# Patient Record
Sex: Female | Born: 1980 | Race: White | Hispanic: No | Smoking: Never smoker
Health system: Southern US, Community
[De-identification: ages and names within clinical notes are randomized; demographics above are authoritative.]

## PROBLEM LIST (undated history)

## (undated) DIAGNOSIS — K5090 Crohn's disease, unspecified, without complications: Secondary | ICD-10-CM

## (undated) DIAGNOSIS — Z789 Other specified health status: Secondary | ICD-10-CM

## (undated) HISTORY — DX: Crohn's disease, unspecified, without complications: K50.90

## (undated) HISTORY — PX: PILONIDAL CYST EXCISION: SHX744

## (undated) HISTORY — PX: CHOLECYSTECTOMY: SHX55

---

## 2015-08-16 ENCOUNTER — Encounter (HOSPITAL_COMMUNITY): Payer: Self-pay

## 2015-08-16 ENCOUNTER — Ambulatory Visit (HOSPITAL_COMMUNITY)
Admission: RE | Admit: 2015-08-16 | Discharge: 2015-08-16 | Disposition: A | Discharge status: Home / Self Care | Payer: Medicaid Other | Source: Ambulatory Visit | Attending: Obstetrics and Gynecology | Admitting: Obstetrics and Gynecology

## 2015-08-16 ENCOUNTER — Other Ambulatory Visit (HOSPITAL_COMMUNITY): Payer: Self-pay | Admitting: Obstetrics and Gynecology

## 2015-08-16 DIAGNOSIS — IMO0002 Reserved for concepts with insufficient information to code with codable children: Secondary | ICD-10-CM | POA: Insufficient documentation

## 2015-08-16 DIAGNOSIS — Z315 Encounter for genetic counseling: Secondary | ICD-10-CM | POA: Insufficient documentation

## 2015-08-16 DIAGNOSIS — Z3A12 12 weeks gestation of pregnancy: Secondary | ICD-10-CM | POA: Insufficient documentation

## 2015-08-16 DIAGNOSIS — O283 Abnormal ultrasonic finding on antenatal screening of mother: Secondary | ICD-10-CM | POA: Diagnosis not present

## 2015-08-16 DIAGNOSIS — Z3682 Encounter for antenatal screening for nuchal translucency: Secondary | ICD-10-CM

## 2015-08-16 DIAGNOSIS — IMO0001 Reserved for inherently not codable concepts without codable children: Secondary | ICD-10-CM

## 2015-08-16 DIAGNOSIS — O358XX0 Maternal care for other (suspected) fetal abnormality and damage, not applicable or unspecified: Secondary | ICD-10-CM

## 2015-08-16 HISTORY — DX: Other specified health status: Z78.9

## 2015-08-16 NOTE — Progress Notes (Signed)
Genetic Counseling  High-Risk Gestation Note  Appointment Date:  08/16/2015 Referred By: Sharlynn Oliphant, DO Date of Birth:  12/05/1981 Partner:  Lisbeth Renshaw   Pregnancy History: [id number] Estimated Date of Delivery: 02/24/16 Estimated Gestational Age: 4w4dAttending: MRenella Cunas MD   I met with Mrs. Jillian Perez and her partner, Mr. BLisbeth Renshaw for genetic counseling because of abnormal ultrasound findings.   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 Complete ultrasound results reported separately.    Ms. CJAHZARA SLATTERYwas counseled that a cystic hygroma describes a septated fluid filled sac at the back of the neck that typically results from failure of the fetal lymphatic system.  We discussed the various etiologies for a hygroma including normal variation (immature lymphatic system), a chromosome condition, single gene condition, or a congenital anomaly. Given the additional ultrasound findings today, we discussed the increased likelihood for an underlying chromosome condition or single gene etiology.   An omphalocele occurs in approximately in every 4000 births (M1:F5) and is defined as the protrusion of abdominal viscera through the umbilical ring, covered by membrane. This defect is thought to arise from failure of lateral body migration and body wall closure or from the embryonic persistence of the body stalk. Approximately  two thirds of all cases have associated anomalies, most commonly including: cardiac defects, neural tube defects, and cleft lip with or without palate.  Various causes of omphaloceles include sporadic, multifactorial, and genetic etiologies. Specifically, we discussed that omphaloceles are associated with a 30-50% risk of fetal aneuploidy (trisomies 13/18), other chromosome aberrations (microdeletions, microduplications, translocations, insertions), complexes such as OEIS, and genetic syndromes including Beckwith-Wiedemann syndrome (BWS) and specific single gene conditions.   We discussed chromosomes, non-disjunction, age related risks for aneuploidy, and the ~50% ultrasound adjusted risk for a chromosome condition based on the finding of a cystic hygroma.  We discussed the common features of Down syndrome, Turner syndrome, and trisomies 13 and 18.  We specifically discussed that the ultrasound findings were more suggestive of a condition such as trisomy 116  We also discussed that cystic hygromas can also be caused by a variety of other chromosome aberrations including: microdeletions, microduplications, and translocations.      We discussed the available screening option of noninvasive prenatal screening (NIPS)/cell free DNA testing. We reviewed the methodology of this screen, the conditions for which it assesses, detection rates and false positive rates. They understand that this screen is highly sensitive and specific but is not diagnostic for these conditions, nor does it assess for all chromosome conditions.   We reviewed the availability of diagnostic testing (chromosome analysis) via [facility] and amniocentesis.  She was counseled regarding the associated risks for complications for each.  Additionally, we discussed that microarray analysis can be performed on cells obtained from [facility] or amniocentesis.  They were counseled that microarray analysis is a molecular based technique in which a test sample of DNA  (fetal) is compared to a reference (normal) genome in order to determine if the test sample has any  extra or missing genetic information.  Microarray analysis allows for the detection of genetic deletions and duplications that are 017 times smaller than those identified by routine chromosome analysis.     After consideration of all the options, Ms. Shelsy A Barwick elected to proceed with NIPS (Panorama) today. She declined invasive, diagnostic testing given the associated risks of complications.    We also discussed that the ultrasound findings can be a feature of many underlying single gene conditions, such as Noonan syndrome, SLOS, and skeletal dysplasias.  We discussed that prenatal diagnosis is available for some single gene conditions, but that in most cases targeted analysis is recommended by a medical geneticist following a post-natal physical exam and review of medical records.  If however, ultrasound findings or a positive family history strongly suggest an increased risk for a specific condition, testing may be available to be performed prenatally via amniocentesis.    We discussed that the fetal prognosis is largely dependent upon the underlying cause of the ultrasound findings, but that there are indications by ultrasound, such as hydrops, which significantly increase the likelihood of a poor prognosis.  She understands that cystic hygromas may worsen and progress to hydrops fetalis, improve and regress, or remain unchanged at follow up ultrasounds.    Ms. Souter is scheduled to have an ultrasound on 09/03/15 to assess for hydrops and fetal viability and will return for a more detailed ultrasound at ~[redacted] weeks gestation on 09/24/15.  We discussed that while many of the causes of a cystic hygroma and omphalocele can be investigated during pregnancy, not all conditions and causes can be determined prenatally.  The couple indicated that they are not currently considering termination of pregnancy.       Both family histories were reviewed and found to be contributory for congenital heart disease for the patient's maternal uncle, requiring surgical correction in childhood. He is otherwise reportedly healthy. Congenital heart defects occur in approximately 1% of pregnancies.  Congenital heart defects may occur due to multifactorial influences, chromosomal abnormalities, genetic syndromes or environmental exposures.  Isolated heart defects are generally multifactorial.  Given the reported family history and assuming multifactorial inheritance, the risk for a congenital heart defect in the current pregnancy would not likely be increased based on this report. Without further information regarding the provided family history, an accurate genetic risk cannot be calculated. Further genetic counseling is warranted if more information is obtained.   Ms. Moree was provided with written information regarding cystic fibrosis (CF) including the carrier frequency and incidence in the Caucasian population, the availability of carrier testing and prenatal diagnosis if indicated.  In addition, we discussed that CF is routinely screened for as part of the [place] newborn screening panel.  She declined additional discussion of CF screening today.    Ms. Gallo denied exposure to environmental toxins or chemical agents. She denied the use of alcohol, tobacco or street drugs. She denied significant viral illnesses during the course of her pregnancy.     I counseled Ms. Albertia A Ravenscroft and her partner regarding the above risks and available options.  The approximate face-to-face time with the genetic counselor was 40 minutes.   Chipper Oman, MS Certified Genetic Counselor 08/16/2015

## 2015-08-16 NOTE — ED Notes (Signed)
Pt reports occasional right side pain.  No pain at this time.

## 2015-08-22 ENCOUNTER — Telehealth (HOSPITAL_COMMUNITY): Payer: Self-pay | Admitting: MS"

## 2015-08-22 NOTE — Telephone Encounter (Signed)
Ms. Jillian Perez called to see if NIPS  (Panorama) results were available yet. Discussed with patient that they are currently still pending but estimated to result out by 9/10. I will call patient when results are available.   Santiago Glad Ashon Rosenberg 08/22/2015

## 2015-08-23 ENCOUNTER — Telehealth (HOSPITAL_COMMUNITY): Payer: Self-pay | Admitting: MS"

## 2015-08-23 ENCOUNTER — Other Ambulatory Visit (HOSPITAL_COMMUNITY): Payer: Self-pay | Admitting: Obstetrics and Gynecology

## 2015-08-23 NOTE — Telephone Encounter (Signed)
Called Jillian Perez to discuss her cell free DNA test results.  Ms. ANHELICA FOWERS had Panorama testing through [facility].  Testing was offered because of abnormal ultrasound findings.   The patient was identified by name and DOB.  We reviewed that these are high risk for Trisomy 3. We reviewed that while not diagnostic, this result is associated with >99% positive predictive value given the fetal ultrasound findings in the pregnancy. We reviewed that the cell free fetal DNA test can not distinguish between aneuploidy confined to the placenta, trisomy 71, partial trisomy 47, or mosaic trisomy 61 and thus, confirmatory testing would be recommended either via amniocentesis or postnatal chromosome analysis. Ms. Mount reiterated that she does not wish to pursue amniocentesis in the pregnancy.   We briefly reviewed the variable features of trisomy 38 and the poor prognosis. We discussed that pregnancies with trisomy 98 have a significantly increased risk for miscarriage and stillbirth (estimated at approximately 70% from [redacted] weeks gestation to term). Of those that survive to term, ~90% of infants die during the first year of life. Ms. Jewell has follow-up ultrasounds scheduled in our office. I offered to meet again with the patient at any point and planned to gather additional resources for the couple. Ms. Marzano was encouraged to call back with additional questions or concerns. She stated that she did not have additional questions at this time.   Additionally, the NIPS showed a less than 1 in 10,000 risk for trisomies 21 and 13, as well as monosomy X (Turner syndrome).  In addition, the risk for triploidy/vanishing twin and sex chromosome trisomies (47,XXX and 47,XXY) was also low risk.  The false positive rate is <0.1% for all conditions. Testing was also consistent with female fetal sex.  The patient did wish to know fetal sex.  She understands that this testing does not identify all  genetic conditions.  All questions were answered to her satisfaction, she was encouraged to call with additional questions or concerns.  Chipper Oman, MS Certified Genetic Counselor 08/23/2015 10:55 AM

## 2015-08-26 ENCOUNTER — Other Ambulatory Visit (HOSPITAL_COMMUNITY): Payer: Self-pay | Admitting: Obstetrics and Gynecology

## 2015-08-26 ENCOUNTER — Encounter (HOSPITAL_COMMUNITY): Payer: Self-pay | Admitting: Obstetrics and Gynecology

## 2015-09-03 ENCOUNTER — Ambulatory Visit (HOSPITAL_COMMUNITY)
Admission: RE | Admit: 2015-09-03 | Discharge: 2015-09-03 | Disposition: A | Discharge status: Home / Self Care | Payer: Medicaid Other | Source: Ambulatory Visit | Attending: Obstetrics and Gynecology | Admitting: Obstetrics and Gynecology

## 2015-09-03 ENCOUNTER — Encounter (HOSPITAL_COMMUNITY): Payer: Self-pay

## 2015-09-03 ENCOUNTER — Ambulatory Visit (HOSPITAL_COMMUNITY): Admission: RE | Admit: 2015-09-03 | Payer: Medicaid Other | Source: Ambulatory Visit

## 2015-09-03 DIAGNOSIS — O021 Missed abortion: Secondary | ICD-10-CM | POA: Diagnosis not present

## 2015-09-03 DIAGNOSIS — Z3A15 15 weeks gestation of pregnancy: Secondary | ICD-10-CM | POA: Diagnosis not present

## 2015-09-03 DIAGNOSIS — O283 Abnormal ultrasonic finding on antenatal screening of mother: Secondary | ICD-10-CM | POA: Diagnosis present

## 2015-09-03 DIAGNOSIS — O351XX0 Maternal care for (suspected) chromosomal abnormality in fetus, not applicable or unspecified: Secondary | ICD-10-CM | POA: Insufficient documentation

## 2015-09-03 NOTE — Consult Note (Signed)
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  The patient is a 34 y.o. G3P2002,at 95w1dwith an EDD of 02/24/2016, by Last Menstrual Period dating method.  In addition to the abnormal cell-free fetal DNA testing several ultrasound abnormalities were identified on her last visit including cystic hygroma, omphalocele, and possible ONTD.  Unfortunately, on today's ultrasound no fetal cardiac activity was found indicating a missed abortion.  Review of Systems Pertinent items are noted in HPI.  Patient History OB History  Gravida Para Term Preterm AB SAB TAB Ectopic Multiple Living  3 2 2       2     # Outcome Date GA Lbr Len/2nd Weight Sex Delivery Anes PTL Lv  3 Current           2 Term 01/31/13 331w6d  Vag-Spont     1 Term 08/12/11     Vag-Spont         Past Medical History  Diagnosis Date  . Medical history non-contributory     Past Surgical History  Procedure Laterality Date  . Cholecystectomy    . Pilonidal cyst excision      Social History   Social History  . Marital Status: Unknown    Spouse Name: N/A  . Number of Children: N/A  . Years of Education: N/A   Social History Main Topics  . Smoking status: Never Smoker   . Smokeless tobacco: None  . Alcohol Use: No  . Drug Use: No  . Sexual Activity: Not Asked   Other Topics Concern  . None   Social History Narrative    History reviewed. No pertinent family history. In addition, the patient has no family history of mental retardation, birth defects, or genetic diseases.  Physical Examination Filed Vitals:   09/03/15 1034  BP: 116/68   Pulse: 67   General appearance - alert, well appearing, and in no distress Abdomen - soft, nontender, nondistended, no masses or organomegaly Pelvic - normal external genitalia, vulva, vagina, cervix, uterus and adnexa Extremities - no pedal edema noted  Assessment and Recommendations 1.  Missed abortion.  This is no unexpected given the likelihood of Trisomy 18.  I reviewed management options including induction of labor and D&E and reviewed the risks and benefits of both with Ms. RoSchergernd her husband.  She wishes to proceed with D&E.  [facility] After obtaining written informed consent for laminaria placement, two laminaria and one sponge were placed today in clinic without difficulty.    I spent 30 minutes with Ms. RoTatesoday of which 50% was face-to-face counseling.  [facility] Please do not hesitate to contact usKoreaith questions.   NIJolyn LentMD

## 2015-09-03 NOTE — ED Notes (Signed)
Pt reports sharp lower abd pain and cramping at times.

## 2015-09-24 ENCOUNTER — Ambulatory Visit (HOSPITAL_COMMUNITY): Payer: Medicaid Other

## 2015-10-11 ENCOUNTER — Encounter (HOSPITAL_COMMUNITY): Payer: Self-pay

## 2015-10-11 NOTE — Addendum Note (Signed)
Encounter addended by: Cottie Banda Corneliussen on: 10/11/2015  9:24 AM<BR>     Documentation filed: Episodes, Problem List

## 2016-12-14 HISTORY — PX: NASAL SEPTUM SURGERY: SHX37

## 2017-09-12 IMAGING — US US MFM OB LIMITED
1 series · 15 of 16 positions shown · non-contrast
Comparison: none

[Series 1: us mfm ob limited · 15 of 16 slices shown]
[im 1/16]
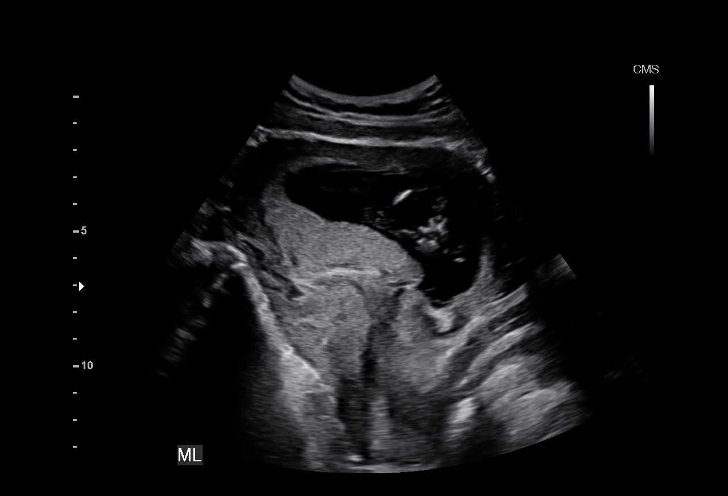
[im 2/16]
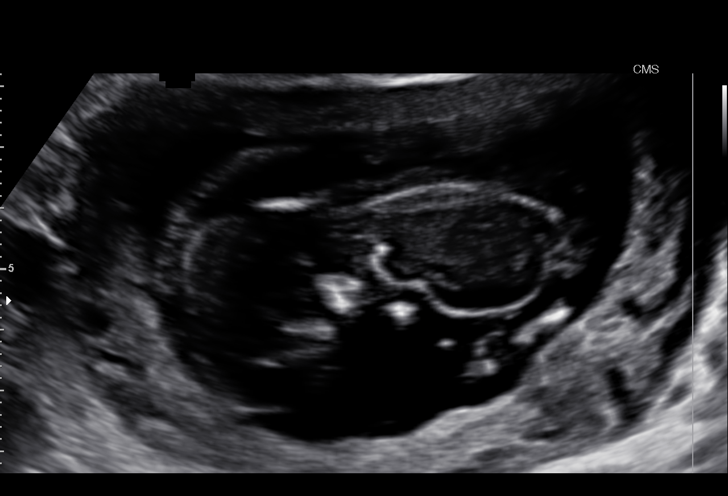
[im 3/16]
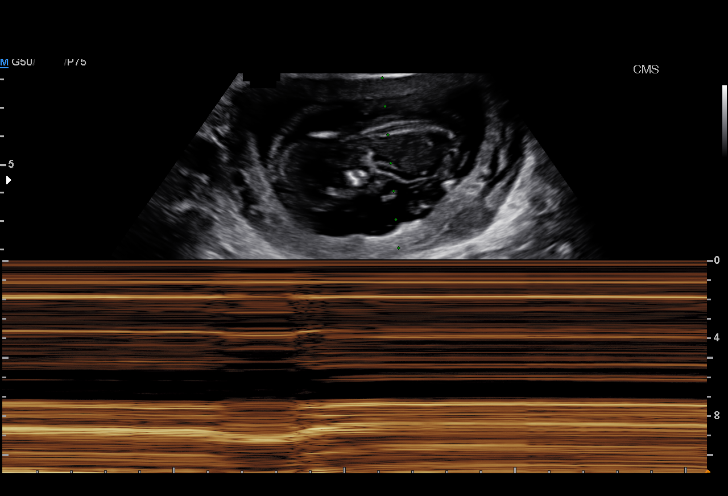
[im 4/16]
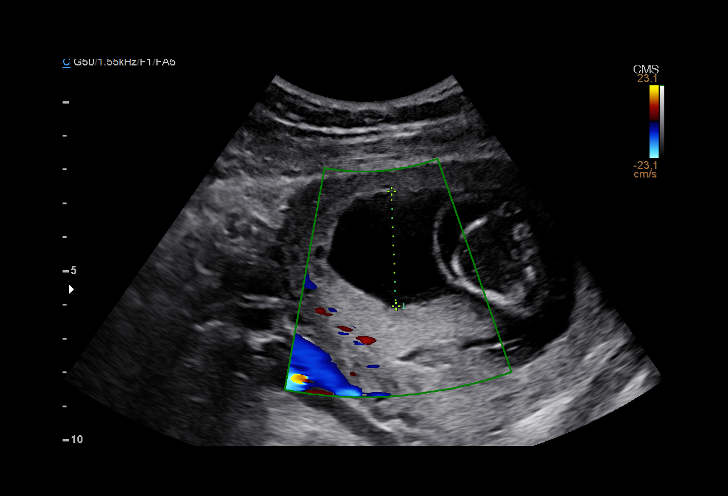
[im 5/16]
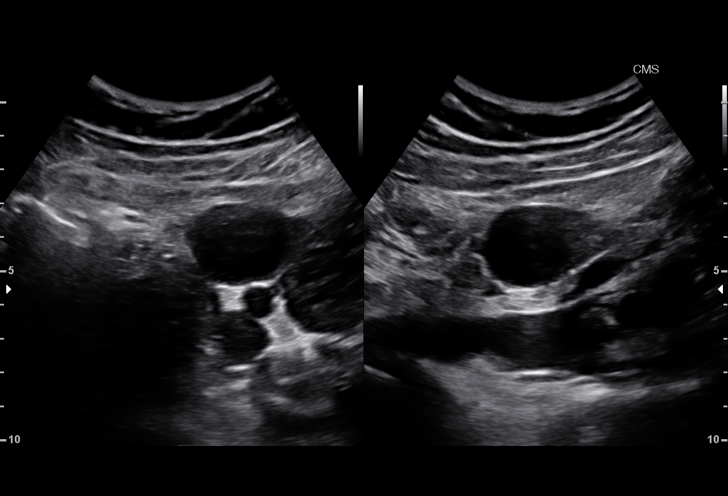
[im 6/16]
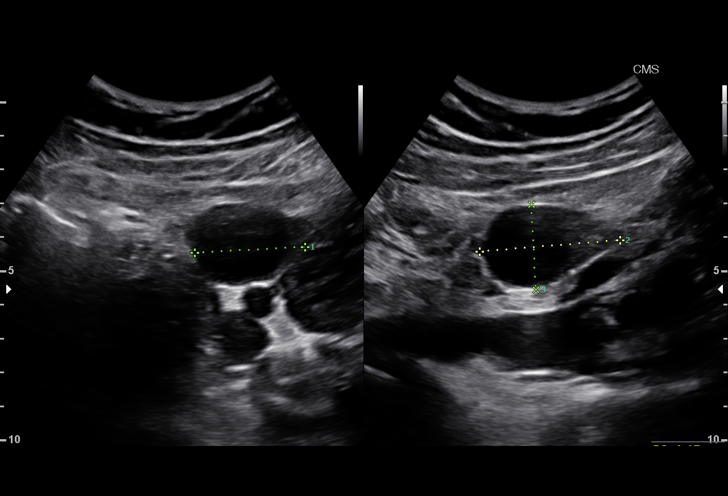
[im 7/16]
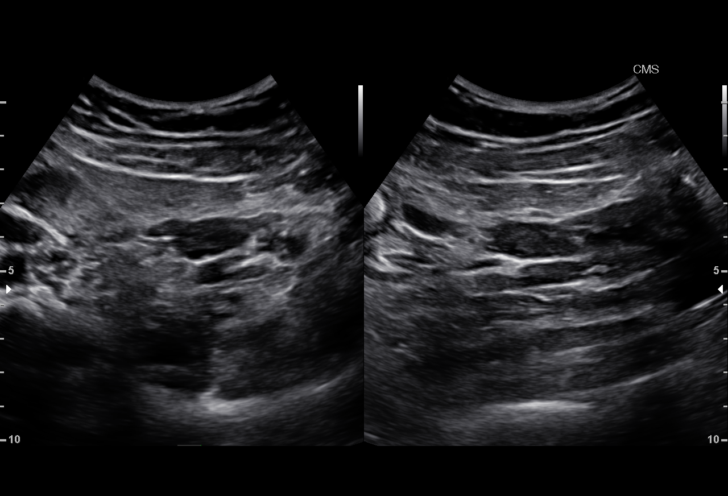
[im 9/16]
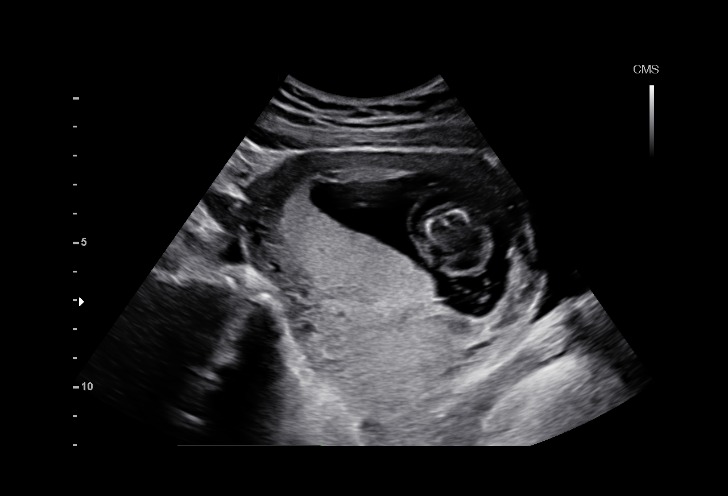
[im 10/16]
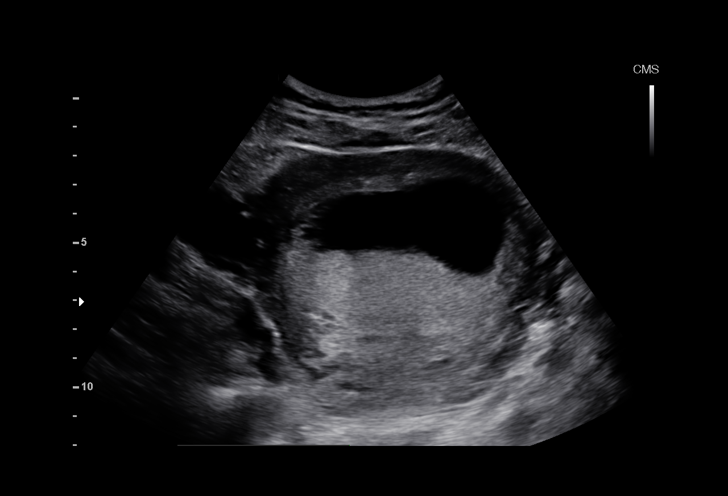
[im 11/16]
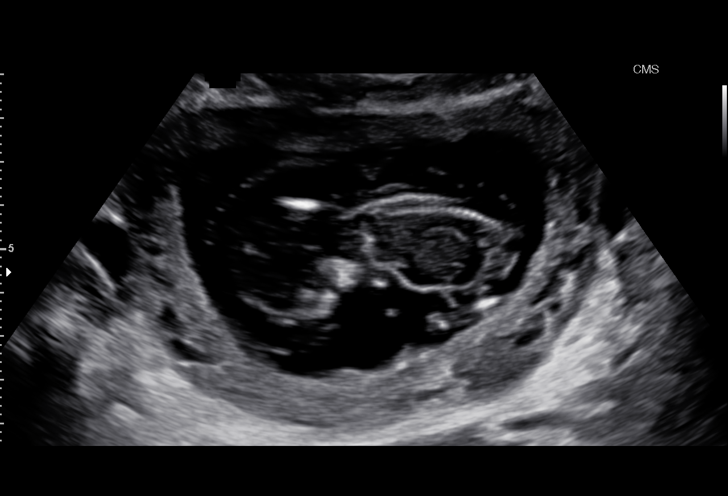
[im 12/16]
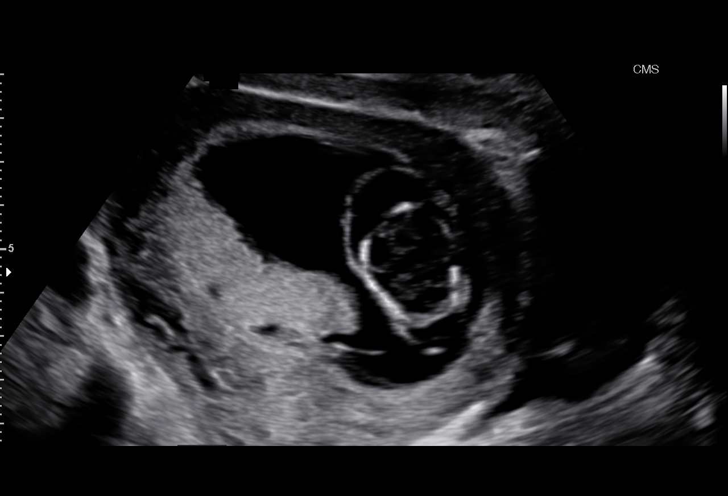
[im 13/16]
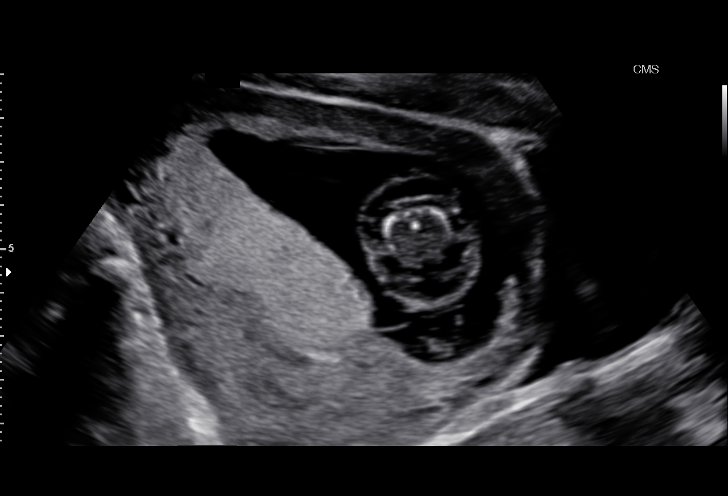
[im 14/16]
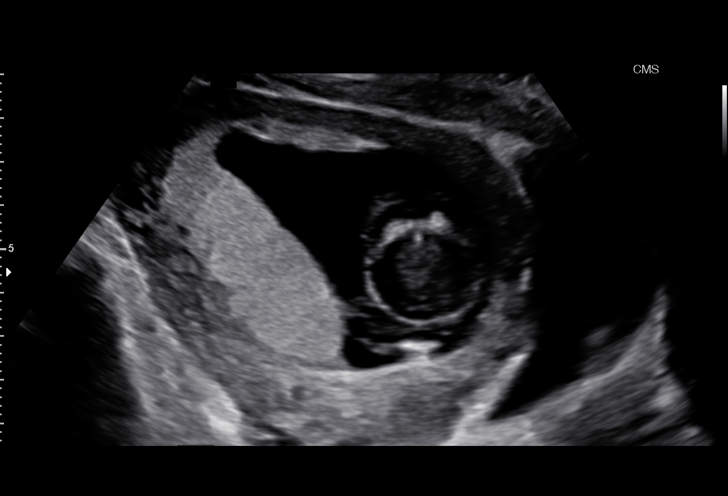
[im 15/16]
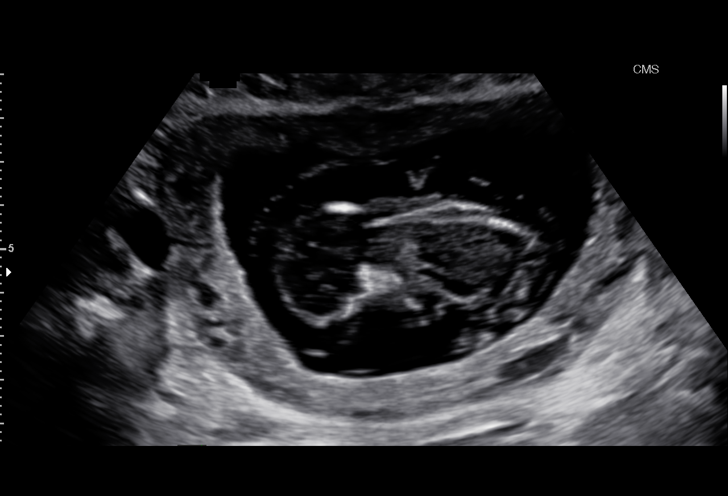
[im 16/16]
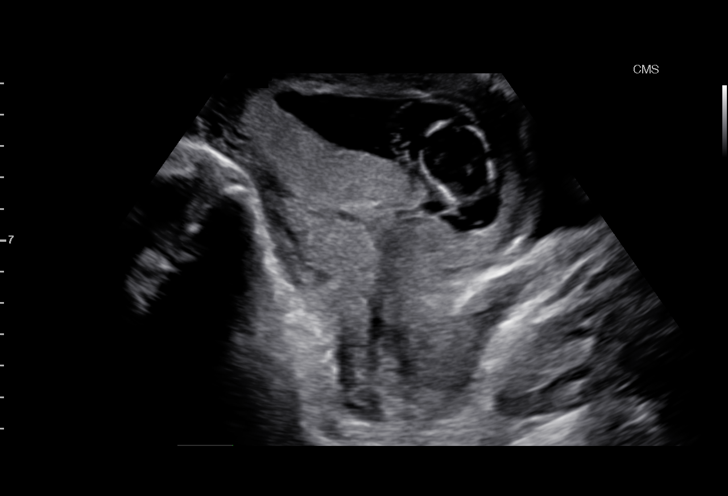

[15 of 16 positions shown; findings below may reference images not displayed]

OBSTETRICS REPORT
(Signed Final 09/03/2015 [DATE])

Service(s) Provided

US MFM OB LIMITED                                     76815.01
Indications

Fetal abnormality - other known or suspected;
thickened NT
15 weeks gestation of pregnancy
Abnormal biochemical finding on antenatal
screening of mother
Fetal Evaluation

Num Of Fetuses:    1
Cardiac Activity:  Absent
Presentation:      Transverse, head to
maternal right
Placenta:          Posterior

Amniotic Fluid
AFI FV:      Subjectively within normal limits
Larg Pckt:     3.4  cm
Gestational Age

LMP:           15w 1d        Date:  05/20/15                 EDD:   02/24/16
Best:          15w 1d     Det. By:  LMP  (05/20/15)          EDD:   02/24/16
Cervix Uterus Adnexa

Cervix:       Closed.
Uterus:       No abnormality visualized.
Cul De Sac:   No free fluid seen.
Left Ovary:    Within normal limits.
Right Ovary:   Within normal limits.

Adnexa:     No abnormality visualized.
Comments

Unfortunately, Ms. Rosean was diagnosed with a missed
abortion today.  I reviewed her management options including
induction of labor and D&E and reviewed the risks and
benefits of both.  She wishes to proceed with D&E.  This has
been scheduled for tomorrow at [facility]
After obtaining written informed consent for laminaria
placement, two laminaria and one sponge were placed today
in clinic without difficulty.
Impression

Missed abortion at 15 weeks 1 day.
Recommendations

See comments.

## 2018-02-18 DIAGNOSIS — H52221 Regular astigmatism, right eye: Secondary | ICD-10-CM | POA: Diagnosis not present

## 2018-02-18 DIAGNOSIS — H5213 Myopia, bilateral: Secondary | ICD-10-CM | POA: Diagnosis not present

## 2018-03-14 ENCOUNTER — Encounter: Payer: Self-pay | Admitting: Gastroenterology

## 2018-03-18 ENCOUNTER — Encounter: Payer: Self-pay | Admitting: Gastroenterology

## 2018-04-22 ENCOUNTER — Ambulatory Visit (INDEPENDENT_AMBULATORY_CARE_PROVIDER_SITE_OTHER): Payer: Medicaid Other | Admitting: Gastroenterology

## 2018-04-22 ENCOUNTER — Encounter: Payer: Self-pay | Admitting: Gastroenterology

## 2018-04-22 VITALS — BP 118/64 | HR 66 | Ht 64.0 in | Wt 191.0 lb

## 2018-04-22 DIAGNOSIS — K529 Noninfective gastroenteritis and colitis, unspecified: Secondary | ICD-10-CM | POA: Diagnosis not present

## 2018-04-22 MED ORDER — MESALAMINE ER 0.375 G PO CP24: 375.0000 mg | ORAL_CAPSULE | Freq: Every day | ORAL | 11 refills | Status: DC

## 2018-04-22 NOTE — Patient Instructions (Signed)
If you are age 36 or older, your body mass index should be between 23-30. Your Body mass index is 32.79 kg/m. If this is out of the aforementioned range listed, please consider follow up with your Primary Care Provider.  If you are age 76 or younger, your body mass index should be between 19-25. Your Body mass index is 32.79 kg/m. If this is out of the aformentioned range listed, please consider follow up with your Primary Care Provider.   Please follow-up with Dr. Lyndel Safe in one year.  Thank you,  Dr. Jackquline Denmark

## 2018-04-22 NOTE — Progress Notes (Signed)
    Chief Complaint: FU  Referring Provider: Dr Clint Bolder    ASSESSMENT AND PLAN;   #1. Colitis (no records available) - Please obtain previous records and update - Continue Apriso 4/day. - Await record de-conversion   HPI:    FU No problems. Patient is tolerating Apriso 4/day well.  No significant GI complaints.  No nonsteroidals. No records  Past Medical History:  Diagnosis Date  . Medical history non-contributory     Past Surgical History:  Procedure Laterality Date  . CHOLECYSTECTOMY    . PILONIDAL CYST EXCISION      Family History  Problem Relation Age of Onset  . Colon cancer Neg Hx     Social History   Tobacco Use  . Smoking status: Never Smoker  . Smokeless tobacco: Never Used  Substance Use Topics  . Alcohol use: No  . Drug use: No    Current Outpatient Medications  Medication Sig Dispense Refill  . fexofenadine (ALLEGRA) 180 MG tablet Take 180 mg by mouth daily.    . mesalamine (APRISO) 0.375 g 24 hr capsule Take 375 mg by mouth daily.     No current facility-administered medications for this visit.     No Known Allergies  Review of Systems:  Constitutional: Denies fever, chills, diaphoresis, appetite change and fatigue.  HEENT: Denies photophobia, eye pain, redness, hearing loss, ear pain, congestion, sore throat, rhinorrhea, sneezing, mouth sores, neck pain, neck stiffness and tinnitus.   Respiratory: Denies SOB, DOE, cough, chest tightness,  and wheezing.   Cardiovascular: Denies chest pain, palpitations and leg swelling.  Genitourinary: Denies dysuria, urgency, frequency, hematuria, flank pain and difficulty urinating.  Musculoskeletal: Denies myalgias, back pain, joint swelling, arthralgias and gait problem.  Skin: No rash.  Neurological: Denies dizziness, seizures, syncope, weakness, light-headedness, numbness and headaches.  Hematological: Denies adenopathy. Easy bruising, personal or family bleeding history  Psychiatric/Behavioral: No  anxiety or depression     Physical Exam:    BP 118/64   Pulse 66   Ht 5' 4"  (1.626 m)   Wt 191 lb (86.6 kg)   BMI 32.79 kg/m  Filed Weights   04/22/18 0910  Weight: 191 lb (86.6 kg)   Constitutional:  Well-developed, in no acute distress. Psychiatric: Normal mood and affect. Behavior is normal. HEENT: Pupils normal.  Conjunctivae are normal. No scleral icterus. Neck supple.  Cardiovascular: Normal rate, regular rhythm. No edema Pulmonary/chest: Effort normal and breath sounds normal. No wheezing, rales or rhonchi. Abdominal: Soft, nondistended. Nontender. Bowel sounds active throughout. There are no masses palpable. No hepatomegaly. Rectal:  defered Neurological: Alert and oriented to person place and time. Skin: Skin is warm and dry. No rashes noted.   Carmell Austria, MD

## 2018-09-26 ENCOUNTER — Other Ambulatory Visit: Payer: Self-pay | Admitting: Gastroenterology

## 2018-09-26 NOTE — Telephone Encounter (Signed)
Pharmacy calling to follow up on refill request for medication apriso. Pharmacy states they need it sent in as 4x daily due to them not being able to break the bottle?

## 2018-09-27 NOTE — Telephone Encounter (Signed)
Please advise 

## 2018-11-15 ENCOUNTER — Telehealth: Payer: Self-pay | Admitting: Gastroenterology

## 2018-11-18 NOTE — Telephone Encounter (Signed)
BG- that is fine.  TW-can we get all her records from [place].  Please see the previous note. Thx

## 2018-11-18 NOTE — Telephone Encounter (Signed)
Called and spoke with patient-patient reports abdominal pain/cramping; rectal bleeding with bowel movements; bad gas; patient is still taking the Apriso 4 caps every AM; patient is requesting what she can do/have to assist with symptom management;  An appt has been scheduled for this patient on 11/25/18 arrival at 3:45 pm for appt at 4:00 pm; if this is not what you feel is required for this patient's plan of care this appt can always be cancelled;  Patient verbalized understanding of information and that office will return call as soon as plan of action is in place; patient also advised to call if questions/concerns arise; Please advise on next step of action;

## 2018-11-21 NOTE — Telephone Encounter (Signed)
I have printed records from [place].

## 2018-11-25 ENCOUNTER — Encounter: Payer: Self-pay | Admitting: Gastroenterology

## 2018-11-25 ENCOUNTER — Telehealth: Payer: Self-pay | Admitting: *Deleted

## 2018-11-25 ENCOUNTER — Ambulatory Visit: Payer: Medicaid Other | Admitting: Gastroenterology

## 2018-11-25 ENCOUNTER — Other Ambulatory Visit (INDEPENDENT_AMBULATORY_CARE_PROVIDER_SITE_OTHER): Payer: Medicaid Other

## 2018-11-25 ENCOUNTER — Encounter (INDEPENDENT_AMBULATORY_CARE_PROVIDER_SITE_OTHER): Payer: Self-pay

## 2018-11-25 VITALS — BP 108/78 | HR 66 | Ht 64.0 in | Wt 195.0 lb

## 2018-11-25 DIAGNOSIS — K50919 Crohn's disease, unspecified, with unspecified complications: Secondary | ICD-10-CM

## 2018-11-25 MED ORDER — PREDNISONE 10 MG PO TABS: ORAL_TABLET | ORAL | 0 refills | Status: DC

## 2018-11-25 NOTE — Patient Instructions (Signed)
If you are age 36 or older, your body mass index should be between 23-30. Your Body mass index is 33.47 kg/m. If this is out of the aforementioned range listed, please consider follow up with your Primary Care Provider.  If you are age 55 or younger, your body mass index should be between 19-25. Your Body mass index is 33.47 kg/m. If this is out of the aformentioned range listed, please consider follow up with your Primary Care Provider.   Please go to the lab on the 2nd floor [address] before you leave the office today.   We have sent the following medications to your pharmacy for you to pick up at your convenience: We have sent the following medications to your pharmacy for you to pick up at your convenience: Prednisone 31m tablet 436mby mouth x 1 week. 3013my mouth x 1 week. 80m48m mouth x 2 weeks. 10mg59mmouth x 2 weeks.  Thank you,  Dr. RajesJackquline Denmark

## 2018-11-25 NOTE — Telephone Encounter (Signed)
Incorrect tube drawn for TB gold to [facility]. Notified pt and advised her we will redraw when she returns her stool samples on Monday. Lab appt made and future order re-entered.

## 2018-11-25 NOTE — Progress Notes (Signed)
Chief Complaint: bloody diarrhea  Referring Provider:  Janie Morning, DO      ASSESSMENT AND PLAN;   #1.  Crohn's disease with exacerbation. Dx 08/2017, involving the left colon up to mid transverse colon.  Had been well-controlled on Apriso until recently.  No perianal or TI disease  Plan: - Check CBC, CMP, sed rate, CRP,  TB gold, TPMT, HBsAg. - GI pathogen, WBC in stool. (Hold off on calprotectin since it is not covered) -Discussed regarding budesonide/prednisone.  Due to the severity of symptoms, would start prednisone 40 mg p.o. once a day for 1 weeks, 30 mg p.o. once a day for 1 weeks, followed by 20 mg p.o. once a day for 2 weeks, then 10 mg p.o. once a day for 2 weeks and then stop. - FU in 8 weeks.  She is to stay in touch with [place] over the phone. - Information about Humira given.  - If she does not get better in the next 2 to 3 days, would proceed with CT Enterography.   HPI:    Jillian Perez is a 37 y.o. female  Lower abdominal crampy pain with bloody diarrhea x 8 weeks, worse over the last 2-3 weeks With nocturnal symptoms Has associated tenesmus 7-8/day, more with eating No fever or chills. Denies having any significant joint pains, skin rash Has mild back pain  Past GI procedures: -EGD 03/31/2010-mild gastritis, negative small bowel biopsies for celiac -Colonoscopy 08/25/2017 colitis involving left colon, moderate activity.  Normal TI and right colon.  Biopsies consistent with Crohn's disease, inflammatory polyp.  Normal TI and right colonic biopsies. Additional social history Dad had lymphoma Mother had amyloidosis, cancer of the breast Past Medical History:  Diagnosis Date  . Medical history non-contributory     Past Surgical History:  Procedure Laterality Date  . CHOLECYSTECTOMY    . PILONIDAL CYST EXCISION      Family History  Problem Relation Age of Onset  . Colon cancer Neg Hx     Social History   Tobacco Use  . Smoking status: Never  Smoker  . Smokeless tobacco: Never Used  Substance Use Topics  . Alcohol use: No  . Drug use: No    Current Outpatient Medications  Medication Sig Dispense Refill  . APRISO 0.375 g 24 hr capsule TAKE ONE CAPSULE BY MOUTH FOUR TIMES DAILY 120 capsule 11  . fexofenadine (ALLEGRA) 180 MG tablet Take 180 mg by mouth daily.     No current facility-administered medications for this visit.     No Known Allergies  Review of Systems:  neg     Physical Exam:    BP 108/78   Pulse 66   Ht 5' 4"  (1.626 m)   Wt 195 lb (88.5 kg)   SpO2 99%   BMI 33.47 kg/m  Filed Weights   11/25/18 1348  Weight: 195 lb (88.5 kg)   Constitutional:  Well-developed, in no acute distress. Psychiatric: Normal mood and affect. Behavior is normal. HEENT: Pupils normal.  Conjunctivae are normal. No scleral icterus. Neck supple.  Cardiovascular: Normal rate, regular rhythm. No edema Pulmonary/chest: Effort normal and breath sounds normal. No wheezing, rales or rhonchi. Abdominal: Soft, nondistended.  Lower abdominal tenderness mild, no rebound.  Bowel sounds active throughout. There are no masses palpable. No hepatomegaly. Rectal:  defered Neurological: Alert and oriented to person place and time. Skin: Skin is warm and dry. No rashes noted.    Carmell Austria, MD 11/25/2018, 2:17 PM  Cc: Janie Morning, DO

## 2018-11-28 ENCOUNTER — Other Ambulatory Visit (INDEPENDENT_AMBULATORY_CARE_PROVIDER_SITE_OTHER): Payer: Medicaid Other

## 2018-11-28 DIAGNOSIS — K50919 Crohn's disease, unspecified, with unspecified complications: Secondary | ICD-10-CM

## 2018-11-30 LAB — COMPREHENSIVE METABOLIC PANEL
AG RATIO: 1.4 (calc) (ref 1.0–2.5)
ALKALINE PHOSPHATASE (APISO): 107 U/L (ref 33–115)
ALT: 26 U/L (ref 6–29)
AST: 20 U/L (ref 10–30)
Albumin: 4.3 g/dL (ref 3.6–5.1)
BUN: 10 mg/dL (ref 7–25)
CHLORIDE: 105 mmol/L (ref 98–110)
CO2: 27 mmol/L (ref 20–32)
Calcium: 9.7 mg/dL (ref 8.6–10.2)
Creat: 0.75 mg/dL (ref 0.50–1.10)
GLOBULIN: 3 g/dL (ref 1.9–3.7)
Glucose, Bld: 88 mg/dL (ref 65–99)
Potassium: 5.2 mmol/L (ref 3.5–5.3)
Sodium: 141 mmol/L (ref 135–146)
Total Bilirubin: 0.3 mg/dL (ref 0.2–1.2)
Total Protein: 7.3 g/dL (ref 6.1–8.1)

## 2018-11-30 LAB — CBC WITH DIFFERENTIAL/PLATELET
ABSOLUTE MONOCYTES: 757 {cells}/uL (ref 200–950)
BASOS ABS: 107 {cells}/uL (ref 0–200)
BASOS PCT: 1.1 %
EOS ABS: 1009 {cells}/uL — AB (ref 15–500)
Eosinophils Relative: 10.4 %
HCT: 39.8 % (ref 35.0–45.0)
Hemoglobin: 12.9 g/dL (ref 11.7–15.5)
Lymphs Abs: 3861 cells/uL (ref 850–3900)
MCH: 28 pg (ref 27.0–33.0)
MCHC: 32.4 g/dL (ref 32.0–36.0)
MCV: 86.5 fL (ref 80.0–100.0)
MPV: 11.8 fL (ref 7.5–12.5)
Monocytes Relative: 7.8 %
Neutro Abs: 3967 cells/uL (ref 1500–7800)
Neutrophils Relative %: 40.9 %
PLATELETS: 343 10*3/uL (ref 140–400)
RBC: 4.6 10*6/uL (ref 3.80–5.10)
RDW: 12.6 % (ref 11.0–15.0)
TOTAL LYMPHOCYTE: 39.8 %
WBC: 9.7 10*3/uL (ref 3.8–10.8)

## 2018-11-30 LAB — HEPATITIS B SURFACE ANTIGEN: Hepatitis B Surface Ag: NONREACTIVE

## 2018-11-30 LAB — SEDIMENTATION RATE: SED RATE: 14 mm/h (ref 0–20)

## 2018-11-30 LAB — QUANTIFERON-TB GOLD PLUS

## 2018-11-30 LAB — C-REACTIVE PROTEIN: CRP: 13 mg/L — AB (ref <8.0)

## 2018-12-01 LAB — FECAL LACTOFERRIN, QUANT
FECAL LACTOFERRIN: POSITIVE — AB
MICRO NUMBER:: 91501812
SPECIMEN QUALITY:: ADEQUATE

## 2018-12-01 LAB — QUANTIFERON-TB GOLD PLUS
Mitogen-NIL: 7 IU/mL
NIL: 0.04 IU/mL
QuantiFERON-TB Gold Plus: NEGATIVE
TB1-NIL: 0.1 IU/mL
TB2-NIL: 0.11 IU/mL

## 2018-12-01 LAB — TPMT GENETIC TEST

## 2018-12-01 LAB — GASTROINTESTINAL PATHOGEN PANEL PCR
C. difficile Tox A/B, PCR: NOT DETECTED
Campylobacter, PCR: NOT DETECTED
Cryptosporidium, PCR: NOT DETECTED
E COLI (ETEC) LT/ST, PCR: NOT DETECTED
E COLI (STEC) STX1/STX2, PCR: NOT DETECTED
E coli 0157, PCR: NOT DETECTED
Giardia lamblia, PCR: NOT DETECTED
NOROVIRUS, PCR: NOT DETECTED
ROTAVIRUS, PCR: NOT DETECTED
SALMONELLA, PCR: NOT DETECTED
Shigella, PCR: NOT DETECTED

## 2019-01-02 ENCOUNTER — Telehealth: Payer: Self-pay | Admitting: Gastroenterology

## 2019-01-02 ENCOUNTER — Other Ambulatory Visit: Payer: Self-pay

## 2019-01-02 DIAGNOSIS — K50919 Crohn's disease, unspecified, with unspecified complications: Secondary | ICD-10-CM

## 2019-01-02 NOTE — Telephone Encounter (Signed)
Patient states her symptoms have not improved and wants to know if Dr.Gupta wants to go ahead with the test they discussed at her last visit on 12.13.2019.

## 2019-01-02 NOTE — Progress Notes (Signed)
Order placed in Epic for CTE as recommended by MD;

## 2019-01-02 NOTE — Telephone Encounter (Signed)
Proceed with CTE See last note

## 2019-01-02 NOTE — Telephone Encounter (Signed)
Called and spoke with patient-patient informed of result note and MD recommendations; patient is agreeable with plan of care and verified PCP; result note routed to PCP per MD recommendations; Patient has been scheduled for CT enterography on 01/10/2019 at [facility]; arrival at 2:00pm for 3:00pm appt; patient advised to be NPO for 4 hrs prior to test and will consume contrast on arrival for procedure; Patient verbalized understanding of information/instructions;  Patient was advised to call the office at [phone] if questions/concerns arise;

## 2019-01-02 NOTE — Telephone Encounter (Signed)
Please advise on MD recommendations;

## 2019-01-10 ENCOUNTER — Ambulatory Visit (HOSPITAL_COMMUNITY)
Admission: RE | Admit: 2019-01-10 | Discharge: 2019-01-10 | Disposition: A | Discharge status: Home / Self Care | Payer: Medicaid Other | Source: Ambulatory Visit | Attending: Gastroenterology | Admitting: Gastroenterology

## 2019-01-10 DIAGNOSIS — K50919 Crohn's disease, unspecified, with unspecified complications: Secondary | ICD-10-CM | POA: Diagnosis not present

## 2019-01-10 DIAGNOSIS — R197 Diarrhea, unspecified: Secondary | ICD-10-CM | POA: Diagnosis not present

## 2019-01-10 MED ORDER — SODIUM CHLORIDE (PF) 0.9 % IJ SOLN
INTRAMUSCULAR | Status: AC
  Filled 2019-01-10: qty 50

## 2019-01-10 MED ORDER — BARIUM SULFATE 0.1 % PO SUSP
ORAL | Status: AC
  Filled 2019-01-10: qty 3

## 2019-01-10 MED ORDER — IOHEXOL 300 MG/ML  SOLN
100.0000 mL | Freq: Once | INTRAMUSCULAR | Status: AC | PRN
  Administered 2019-01-10: 100 mL via INTRAVENOUS

## 2019-01-11 ENCOUNTER — Telehealth: Payer: Self-pay | Admitting: Gastroenterology

## 2019-01-11 NOTE — Telephone Encounter (Signed)
[facility] called to give results on pt's CT.

## 2019-01-11 NOTE — Telephone Encounter (Signed)
Called and spoke with Marzetta Board at [facility] CT -she wanted to ensure the provider was provided with the CT entero results form patient's scan; results printed and available for Dr. Lyndel Safe to review;

## 2019-01-17 ENCOUNTER — Other Ambulatory Visit: Payer: Self-pay

## 2019-01-17 DIAGNOSIS — K50919 Crohn's disease, unspecified, with unspecified complications: Secondary | ICD-10-CM

## 2019-01-17 MED ORDER — ADALIMUMAB 40 MG/0.4ML ~~LOC~~ AJKT: 40.0000 mg | AUTO-INJECTOR | SUBCUTANEOUS | 11 refills | Status: DC

## 2019-01-17 MED ORDER — ADALIMUMAB 80 MG/0.8ML ~~LOC~~ AJKT: 80.0000 mg | AUTO-INJECTOR | SUBCUTANEOUS | 0 refills | Status: DC

## 2019-01-17 MED ORDER — MESALAMINE 4 G RE ENEM: 4.0000 g | ENEMA | Freq: Every day | RECTAL | 1 refills | Status: DC

## 2019-01-17 MED ORDER — AZATHIOPRINE 50 MG PO TABS: ORAL_TABLET | ORAL | 3 refills | Status: DC

## 2019-01-26 ENCOUNTER — Telehealth: Payer: Self-pay | Admitting: Gastroenterology

## 2019-01-26 NOTE — Telephone Encounter (Signed)
Called and spoke with patient-patient reports she is NOT taking the Prednisone, however, she is taking the Apriso, the Imuran, and the Rowasa enemas-patient reports she is doing great at this time; Humira was approved by her insurance and she is requesting to know if she needs to start on the Humira and when-  please advise on next step in plan of care for this patient-

## 2019-01-30 NOTE — Telephone Encounter (Signed)
Called and spoke with patient-patient informed of result note and MD recommendations; patient is agreeable with plan of care; Patient verbalized understanding of starting Humira as per MD recommendations as long as patient is not currently running a fever or sick at this time; Patient verbalized understanding of information/instructions;  Patient was advised to call the office at [phone] if questions/concerns arise;

## 2019-01-30 NOTE — Telephone Encounter (Signed)
CTE reviewed See plan as per CTE note  Lets start Humira as soon as possible. Humira day 1 120m, day 15 874m day 29- 4018mQ every other week. FU in 4-6 weeks Continue rest of meds for now

## 2019-01-31 ENCOUNTER — Other Ambulatory Visit (INDEPENDENT_AMBULATORY_CARE_PROVIDER_SITE_OTHER): Payer: Medicaid Other

## 2019-01-31 DIAGNOSIS — K50919 Crohn's disease, unspecified, with unspecified complications: Secondary | ICD-10-CM

## 2019-01-31 LAB — CBC WITH DIFFERENTIAL/PLATELET
Basophils Absolute: 0.1 10*3/uL (ref 0.0–0.1)
Basophils Relative: 1.1 % (ref 0.0–3.0)
EOS ABS: 0.9 10*3/uL — AB (ref 0.0–0.7)
Eosinophils Relative: 10.8 % — ABNORMAL HIGH (ref 0.0–5.0)
HEMATOCRIT: 39.2 % (ref 36.0–46.0)
Hemoglobin: 12.6 g/dL (ref 12.0–15.0)
Lymphocytes Relative: 41.3 % (ref 12.0–46.0)
Lymphs Abs: 3.5 10*3/uL (ref 0.7–4.0)
MCHC: 32.2 g/dL (ref 30.0–36.0)
MCV: 87.2 fl (ref 78.0–100.0)
Monocytes Absolute: 0.6 10*3/uL (ref 0.1–1.0)
Monocytes Relative: 7.7 % (ref 3.0–12.0)
Neutro Abs: 3.3 10*3/uL (ref 1.4–7.7)
Neutrophils Relative %: 39.1 % — ABNORMAL LOW (ref 43.0–77.0)
Platelets: 277 10*3/uL (ref 150.0–400.0)
RBC: 4.5 Mil/uL (ref 3.87–5.11)
RDW: 13.9 % (ref 11.5–15.5)
WBC: 8.4 10*3/uL (ref 4.0–10.5)

## 2019-01-31 LAB — COMPREHENSIVE METABOLIC PANEL
ALT: 21 U/L (ref 0–35)
AST: 18 U/L (ref 0–37)
Albumin: 4.1 g/dL (ref 3.5–5.2)
Alkaline Phosphatase: 90 U/L (ref 39–117)
BUN: 13 mg/dL (ref 6–23)
CO2: 28 mEq/L (ref 19–32)
Calcium: 9.5 mg/dL (ref 8.4–10.5)
Chloride: 105 mEq/L (ref 96–112)
Creatinine, Ser: 0.82 mg/dL (ref 0.40–1.20)
GFR: 78.46 mL/min (ref >60.00)
Glucose, Bld: 53 mg/dL — ABNORMAL LOW (ref 70–99)
Potassium: 4.9 mEq/L (ref 3.5–5.1)
Sodium: 141 mEq/L (ref 135–145)
Total Bilirubin: 0.4 mg/dL (ref 0.2–1.2)
Total Protein: 7.2 g/dL (ref 6.0–8.3)

## 2019-01-31 LAB — C-REACTIVE PROTEIN: CRP: <1 mg/dL (ref 0.5–20.0)

## 2019-02-14 ENCOUNTER — Other Ambulatory Visit (INDEPENDENT_AMBULATORY_CARE_PROVIDER_SITE_OTHER): Payer: Medicaid Other

## 2019-02-14 DIAGNOSIS — K50919 Crohn's disease, unspecified, with unspecified complications: Secondary | ICD-10-CM

## 2019-02-14 LAB — CBC WITH DIFFERENTIAL/PLATELET
Basophils Absolute: 0.1 10*3/uL (ref 0.0–0.1)
Basophils Relative: 0.9 % (ref 0.0–3.0)
Eosinophils Absolute: 0.4 10*3/uL (ref 0.0–0.7)
Eosinophils Relative: 4.8 % (ref 0.0–5.0)
HCT: 38.8 % (ref 36.0–46.0)
Hemoglobin: 12.6 g/dL (ref 12.0–15.0)
Lymphocytes Relative: 48.7 % — ABNORMAL HIGH (ref 12.0–46.0)
Lymphs Abs: 3.7 10*3/uL (ref 0.7–4.0)
MCHC: 32.4 g/dL (ref 30.0–36.0)
MCV: 87.5 fl (ref 78.0–100.0)
Monocytes Absolute: 0.4 10*3/uL (ref 0.1–1.0)
Monocytes Relative: 5.5 % (ref 3.0–12.0)
Neutro Abs: 3 10*3/uL (ref 1.4–7.7)
Neutrophils Relative %: 40.1 % — ABNORMAL LOW (ref 43.0–77.0)
Platelets: 282 10*3/uL (ref 150.0–400.0)
RBC: 4.44 Mil/uL (ref 3.87–5.11)
RDW: 14.1 % (ref 11.5–15.5)
WBC: 7.5 10*3/uL (ref 4.0–10.5)

## 2019-02-14 LAB — COMPREHENSIVE METABOLIC PANEL
ALK PHOS: 77 U/L (ref 39–117)
ALT: 26 U/L (ref 0–35)
AST: 23 U/L (ref 0–37)
Albumin: 4.5 g/dL (ref 3.5–5.2)
BUN: 14 mg/dL (ref 6–23)
CO2: 28 mEq/L (ref 19–32)
Calcium: 9.5 mg/dL (ref 8.4–10.5)
Chloride: 104 mEq/L (ref 96–112)
Creatinine, Ser: 0.71 mg/dL (ref 0.40–1.20)
GFR: 92.63 mL/min (ref >60.00)
Glucose, Bld: 91 mg/dL (ref 70–99)
POTASSIUM: 4.3 meq/L (ref 3.5–5.1)
Sodium: 139 mEq/L (ref 135–145)
TOTAL PROTEIN: 7.2 g/dL (ref 6.0–8.3)
Total Bilirubin: 0.7 mg/dL (ref 0.2–1.2)

## 2019-02-14 LAB — C-REACTIVE PROTEIN: CRP: <1 mg/dL (ref 0.5–20.0)

## 2019-02-16 ENCOUNTER — Ambulatory Visit: Payer: Medicaid Other | Admitting: Gastroenterology

## 2019-02-28 ENCOUNTER — Ambulatory Visit: Payer: Medicaid Other | Admitting: Gastroenterology

## 2019-03-14 ENCOUNTER — Other Ambulatory Visit: Payer: Self-pay | Admitting: Gastroenterology

## 2019-03-14 DIAGNOSIS — K50919 Crohn's disease, unspecified, with unspecified complications: Secondary | ICD-10-CM

## 2019-04-18 DIAGNOSIS — F4321 Adjustment disorder with depressed mood: Secondary | ICD-10-CM | POA: Diagnosis not present

## 2019-04-25 DIAGNOSIS — F4321 Adjustment disorder with depressed mood: Secondary | ICD-10-CM | POA: Diagnosis not present

## 2019-05-09 DIAGNOSIS — F4321 Adjustment disorder with depressed mood: Secondary | ICD-10-CM | POA: Diagnosis not present

## 2019-05-10 ENCOUNTER — Other Ambulatory Visit: Payer: Self-pay | Admitting: Gastroenterology

## 2019-05-10 DIAGNOSIS — K50919 Crohn's disease, unspecified, with unspecified complications: Secondary | ICD-10-CM

## 2019-05-23 DIAGNOSIS — F4321 Adjustment disorder with depressed mood: Secondary | ICD-10-CM | POA: Diagnosis not present

## 2019-05-30 DIAGNOSIS — F4321 Adjustment disorder with depressed mood: Secondary | ICD-10-CM | POA: Diagnosis not present

## 2019-06-06 DIAGNOSIS — F4321 Adjustment disorder with depressed mood: Secondary | ICD-10-CM | POA: Diagnosis not present

## 2019-06-27 DIAGNOSIS — F4321 Adjustment disorder with depressed mood: Secondary | ICD-10-CM | POA: Diagnosis not present

## 2019-08-04 ENCOUNTER — Other Ambulatory Visit: Payer: Self-pay | Admitting: Gastroenterology

## 2019-08-04 DIAGNOSIS — K50919 Crohn's disease, unspecified, with unspecified complications: Secondary | ICD-10-CM

## 2019-10-16 ENCOUNTER — Other Ambulatory Visit: Payer: Self-pay | Admitting: Gastroenterology

## 2019-10-16 NOTE — Telephone Encounter (Signed)
Can patient have refill of the Apriso or do you want to her to be seen in the office before approving medication since she is suppose to be on Humira as well and haven't followed up with [place] recently?

## 2020-01-08 ENCOUNTER — Telehealth (INDEPENDENT_AMBULATORY_CARE_PROVIDER_SITE_OTHER): Payer: Medicaid Other | Admitting: Gastroenterology

## 2020-01-08 ENCOUNTER — Other Ambulatory Visit: Payer: Self-pay

## 2020-01-08 VITALS — Ht 64.0 in | Wt 190.0 lb

## 2020-01-08 DIAGNOSIS — K50919 Crohn's disease, unspecified, with unspecified complications: Secondary | ICD-10-CM

## 2020-01-08 MED ORDER — AZATHIOPRINE 50 MG PO TABS: 50.0000 mg | ORAL_TABLET | Freq: Every day | ORAL | 11 refills | Status: DC

## 2020-01-08 MED ORDER — PREDNISONE 10 MG PO TABS: ORAL_TABLET | ORAL | 0 refills | Status: DC

## 2020-01-08 NOTE — Patient Instructions (Signed)
If you are age 38 or older, your body mass index should be between 23-30. Your Body mass index is 32.61 kg/m. If this is out of the aforementioned range listed, please consider follow up with your Primary Care Provider.  If you are age 40 or younger, your body mass index should be between 19-25. Your Body mass index is 32.61 kg/m. If this is out of the aformentioned range listed, please consider follow up with your Primary Care Provider.   We have sent the following medications to your pharmacy for you to pick up at your convenience: Prednisone  Imuran   Please go to the lab at [facility] ([place] You will need to go to level "B", you do not need an appointment for this. Hours available are 7:30 am - 4:30 pm.   Follow up in 8 weeks.  Thank you,  Dr. Jackquline Denmark

## 2020-01-08 NOTE — Progress Notes (Signed)
Chief Complaint: bloody diarrhea  Referring Provider:  Janie Morning, DO      ASSESSMENT AND PLAN;   #1.  Crohn's disease with exacerbation. Dx 08/2017, involving the left colon up to mid transverse colon.  No perianal or TI disease. CTE 12/2018: Involving sigmoid colon and rectum.  Plan: - Continue Humira 55m Q2 weekly - Restart Imuran 551mpo qd #30, 11 refills. - Check CBC, CMP, sed rate, CRP, ADA antibody and trough level. -Stool studies: GI pathogen and calprotectin - Discussed regarding budesonide/prednisone.  Due to the severity of symptoms, would start prednisone 40 mg p.o. once a day for 1 week, 30 mg p.o. once a day for 1 week, followed by 20 mg p.o. once a day for 2 weeks, then 10 mg p.o. once a day for 2 weeks and then stop. - FU in 8 weeks.  She is to stay in touch with [place] the phone. - At FU, needs colon with MiraLAX preparation.   HPI:    Jillian Perez a [age].o. female  Lower abdominal crampy pain with bloody diarrhea x 8 weeks, worse over the last 2-3 weeks.  Stopped Imuran 2 months ago due to insurance reasons. With nocturnal symptoms Has associated tenesmus 7-8/day, more with eating No fever or chills. Denies having any significant joint pains, skin rash Has mild back pain No recent antibiotics.  Able to tolerate p.o. without any problems. Had similar problems a year ago.  CTE as below.  Past GI procedures: -EGD 03/31/2010-mild gastritis, negative small bowel biopsies for celiac -Colonoscopy 08/25/2017 colitis involving left colon, moderate activity.  Normal TI and right colon. Bx- Crohn's disease, inflammatory polyp.  Normal TI and right colonic biopsies. -CTE 12/2018: 1. Long segment of uniform bowel wall thickening involving the entire sigmoid colon and rectum consistent with segmental colitis. Extensive adenopathy within the sigmoid mesocolon most consistent with inflammatory or infectious process. Differential would include inflammatory  bowel disease (ulcer colitis versus Crohn's disease) versus infectious colitis versus unlikely ischemic colitis. 2. Remainder of the GI tract is normal. 3. Postcholecystectomy.   Additional social history Dad had lymphoma Mother had amyloidosis, cancer of the breast Past Medical History:  Diagnosis Date  . Medical history non-contributory     Past Surgical History:  Procedure Laterality Date  . CHOLECYSTECTOMY    . PILONIDAL CYST EXCISION      Family History  Problem Relation Age of Onset  . Colon cancer Neg Hx     Social History   Tobacco Use  . Smoking status: Never Smoker  . Smokeless tobacco: Never Used  Substance Use Topics  . Alcohol use: No  . Drug use: No    Current Outpatient Medications  Medication Sig Dispense Refill  . APRISO 0.375 g 24 hr capsule TAKE ONE CAPSULE BY MOUTH FOUR TIMES DAILY (Patient taking differently: 1.5 g daily with breakfast. ) 120 capsule 11  . azaTHIOprine (IMURAN) 50 MG tablet TAKE 1/2 TABLET BY MOUTH EVERY DAY FOR 1 WEEK, THEN 1 TABLET DAILY THEREAFTER (Patient not taking: Reported on 01/04/2020) 30 tablet 3  . FIBER PO Take by mouth.    . Marland KitchenUMIRA PEN 40 MG/0.4ML PNKT INJECT 40MG UNDER THE SKIN EVERY 2 WEEKS 2 each 4  . Multiple Vitamin (MULTIVITAMIN) tablet Take 1 tablet by mouth daily.     No current facility-administered medications for this visit.    No Known Allergies  Review of Systems:  neg     Physical Exam:  Ht 5' 4"  (1.626 m)   Wt 190 lb (86.2 kg)   BMI 32.61 kg/m  Filed Weights   01/08/20 0855  Weight: 190 lb (86.2 kg)   Not examined since it was a televisit. CTE was reviewed  I connected with  Jillian Perez on 01/08/20 by a video enabled telemedicine application and verified that I am speaking with the correct person using two identifiers.   I discussed the limitations of evaluation and management by telemedicine. The patient expressed understanding and agreed to proceed.  Time spent on phone  call, review of records, call in prescriptions: 25 minutes.    Carmell Austria, MD 01/08/2020, 3:45 PM  Cc: Janie Morning, DO

## 2020-05-10 ENCOUNTER — Other Ambulatory Visit: Payer: Self-pay

## 2020-05-10 DIAGNOSIS — K50919 Crohn's disease, unspecified, with unspecified complications: Secondary | ICD-10-CM

## 2020-05-10 MED ORDER — HUMIRA (2 PEN) 40 MG/0.4ML ~~LOC~~ AJKT: AUTO-INJECTOR | SUBCUTANEOUS | 4 refills | Status: DC

## 2020-07-12 DIAGNOSIS — R509 Fever, unspecified: Secondary | ICD-10-CM | POA: Diagnosis not present

## 2020-07-12 DIAGNOSIS — Z20822 Contact with and (suspected) exposure to covid-19: Secondary | ICD-10-CM | POA: Diagnosis not present

## 2020-07-16 ENCOUNTER — Telehealth: Payer: Self-pay | Admitting: Gastroenterology

## 2020-07-16 NOTE — Telephone Encounter (Signed)
Patient called in complaining of a flare up with her crohn's. She is having 10- 15 bloody stools daily with cramping. Her symptoms have been on going for 2 weeks. There has been no change in her medication. Patient has an appointment in October, and would like to know what she can do for relief.

## 2020-07-19 ENCOUNTER — Other Ambulatory Visit: Payer: Self-pay | Admitting: Gastroenterology

## 2020-07-19 MED ORDER — PREDNISONE 10 MG PO TABS: ORAL_TABLET | ORAL | 0 refills | Status: DC

## 2020-07-19 NOTE — Telephone Encounter (Signed)
Spoke to patient to inform her of Dr Steve Rattler recommendations. She will have her labs drawn on Monday. Prednisone taper has been sent to her pharmacy. She will follow up in office as scheduled. All questions answered. Patient voiced understanding.

## 2020-07-19 NOTE — Telephone Encounter (Signed)
Hi Kelly, Can you please get the labs done as per last note Also check stool for GI pathogens and calprotectin Start prednisone 40 mg p.o. once a day for 1 week, then 30 once a day for 1 week, then 20 once a day for 1 week, then continue 10 mg p.o. once a day until follow-up visit.  RG

## 2020-07-22 ENCOUNTER — Other Ambulatory Visit (INDEPENDENT_AMBULATORY_CARE_PROVIDER_SITE_OTHER): Payer: Medicaid Other

## 2020-07-22 DIAGNOSIS — K50919 Crohn's disease, unspecified, with unspecified complications: Secondary | ICD-10-CM

## 2020-07-22 LAB — SEDIMENTATION RATE: Sed Rate: 14 mm/hr (ref 0–20)

## 2020-07-22 LAB — COMPREHENSIVE METABOLIC PANEL
ALT: 85 U/L — ABNORMAL HIGH (ref 0–35)
AST: 62 U/L — ABNORMAL HIGH (ref 0–37)
Albumin: 4 g/dL (ref 3.5–5.2)
Alkaline Phosphatase: 103 U/L (ref 39–117)
BUN: 9 mg/dL (ref 6–23)
CO2: 27 mEq/L (ref 19–32)
Calcium: 8.8 mg/dL (ref 8.4–10.5)
Chloride: 105 mEq/L (ref 96–112)
Creatinine, Ser: 0.74 mg/dL (ref 0.40–1.20)
GFR: 87.63 mL/min (ref >60.00)
Glucose, Bld: 93 mg/dL (ref 70–99)
Potassium: 4.3 mEq/L (ref 3.5–5.1)
Sodium: 139 mEq/L (ref 135–145)
Total Bilirubin: 0.4 mg/dL (ref 0.2–1.2)
Total Protein: 7 g/dL (ref 6.0–8.3)

## 2020-07-22 LAB — C-REACTIVE PROTEIN: CRP: 1 mg/dL (ref 0.5–20.0)

## 2020-07-24 LAB — GI PROFILE, STOOL, PCR

## 2020-07-25 ENCOUNTER — Other Ambulatory Visit: Payer: Self-pay | Admitting: Gastroenterology

## 2020-07-25 DIAGNOSIS — K50919 Crohn's disease, unspecified, with unspecified complications: Secondary | ICD-10-CM

## 2020-09-09 DIAGNOSIS — U071 COVID-19: Secondary | ICD-10-CM | POA: Diagnosis not present

## 2020-09-09 DIAGNOSIS — J302 Other seasonal allergic rhinitis: Secondary | ICD-10-CM | POA: Diagnosis not present

## 2020-09-09 DIAGNOSIS — R05 Cough: Secondary | ICD-10-CM | POA: Diagnosis not present

## 2020-09-17 ENCOUNTER — Ambulatory Visit: Payer: Medicaid Other | Admitting: Gastroenterology

## 2020-10-25 ENCOUNTER — Other Ambulatory Visit: Payer: Self-pay | Admitting: Gastroenterology

## 2020-11-11 ENCOUNTER — Encounter: Payer: Self-pay | Admitting: Gastroenterology

## 2020-11-11 ENCOUNTER — Other Ambulatory Visit: Payer: Self-pay

## 2020-11-11 ENCOUNTER — Ambulatory Visit (INDEPENDENT_AMBULATORY_CARE_PROVIDER_SITE_OTHER): Payer: Medicaid Other | Admitting: Gastroenterology

## 2020-11-11 VITALS — BP 112/70 | HR 78 | Ht 64.0 in | Wt 204.0 lb

## 2020-11-11 DIAGNOSIS — K50919 Crohn's disease, unspecified, with unspecified complications: Secondary | ICD-10-CM | POA: Diagnosis not present

## 2020-11-11 DIAGNOSIS — K581 Irritable bowel syndrome with constipation: Secondary | ICD-10-CM | POA: Diagnosis not present

## 2020-11-11 MED ORDER — PREDNISONE 5 MG PO TABS: ORAL_TABLET | ORAL | 0 refills | Status: DC

## 2020-11-11 MED ORDER — DICYCLOMINE HCL 10 MG PO CAPS: ORAL_CAPSULE | ORAL | 2 refills | Status: DC

## 2020-11-11 MED ORDER — HUMIRA (2 PEN) 40 MG/0.4ML ~~LOC~~ AJKT: AUTO-INJECTOR | SUBCUTANEOUS | 4 refills | Status: DC

## 2020-11-11 NOTE — Patient Instructions (Addendum)
If you are age 38 or older, your body mass index should be between 23-30. Your Body mass index is 35.02 kg/m. If this is out of the aforementioned range listed, please consider follow up with your Primary Care Provider.  If you are age 75 or younger, your body mass index should be between 19-25. Your Body mass index is 35.02 kg/m. If this is out of the aformentioned range listed, please consider follow up with your Primary Care Provider.   Please go to the lab at [facility] ([place] You will need to go to level "B", you do not need an appointment for this. Hours available are 7:30 am - 4:30 pm.   Please do your bloodwork after 3rd dose of Humira.   We have sent the following medications to your pharmacy for you to pick up at your convenience: Bentyl  Humira Prednisone   You have been scheduled for a colonoscopy. Please follow written instructions given to you at your visit today.  Please pick up your prep supplies at the pharmacy within the next 1-3 days. If you use inhalers (even only as needed), please bring them with you on the day of your procedure.   Thank you,  Dr. Jackquline Denmark

## 2020-11-11 NOTE — Progress Notes (Signed)
Chief Complaint: FU  Referring Provider:  Janie Morning, DO      ASSESSMENT AND PLAN;   #1.  L sided Crohn's colitis with exacerbation. Dx 08/2017 on colon, involving the left colon up to mid transverse colon.  No perianal or TI disease. CTE 12/2018: Involving sigmoid colon and rectum. Started Humira/imuran 01/2019. Neg stool GI pathogen. Nl CRP 07/2020  #2. 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  Plan: - Resume Humira 52m Q2 weekly (missed 2-3 doses d/t INS reasons) - Continue Imuran 541mpo qd #30, 11 refills.  May increase dose if needed. - Check CBC, CMP, sed rate, CRP, ADA antibody and trough level (after 3 rd dose). - Stool studies: GI pathogen, elastase and calprotectin - Continue apriso 4/day for now.  May stop it after colonoscopy. - Continue lactose-free diet. - Reduce Pred 5 mg p.o. QD for 2 weeks and then stop.  Have discussed long-term side effects. - Colon with MiraLAX preparation.  Explained risks and benefits. - Bentyl 101mo QID x 1 week, then BID.   HPI:    Jillian Perez a [age]o. female   For FU Does feel somewhat better  Now with 2-3 BMs/day- not formed, some rare blood, no nocturnal S/S (used to be 7-8/day)  Bloating with lower abdominal crampy pain which is relieved by defecation. No milk or cheese as it causes her to have more bloating.  Prednisone has been reduced to 10 mg/day.  Unfortunately, she changed her insurance.  Ran out of Humira.  Has not taken Humira for 3 doses.  Could not tell a whole lot of difference.  No fever or chills. Denies having any significant joint pains, skin rash  No recent antibiotics.  I have extensively discussed the side effects of prednisone including weight gain, diabetes, osteoporosis, cataracts, glaucoma and psychiatric manifestations.   Past GI procedures: -EGD 03/31/2010-mild gastritis, negative small bowel biopsies for celiac -Colonoscopy 08/25/2017 colitis involving left  colon, moderate activity.  Normal TI and right colon. Bx- Crohn's disease, inflammatory polyp.  Normal TI and right colonic biopsies. -CTE 12/2018: 1. Long segment of uniform bowel wall thickening involving the entire sigmoid colon and rectum consistent with segmental colitis. Extensive adenopathy within the sigmoid mesocolon most consistent with inflammatory or infectious process. Differential would include inflammatory bowel disease (ulcer colitis versus Crohn's disease) versus infectious colitis versus unlikely ischemic colitis. 2. Remainder of the GI tract is normal. 3. Postcholecystectomy.   Additional social history Dad had lymphoma Mother had amyloidosis, cancer of the breast Past Medical History:  Diagnosis Date  . Medical history non-contributory     Past Surgical History:  Procedure Laterality Date  . CHOLECYSTECTOMY    . PILONIDAL CYST EXCISION      Family History  Problem Relation Age of Onset  . Colon cancer Neg Hx     Social History   Tobacco Use  . Smoking status: Never Smoker  . Smokeless tobacco: Never Used  Vaping Use  . Vaping Use: Never used  Substance Use Topics  . Alcohol use: No  . Drug use: No    Current Outpatient Medications  Medication Sig Dispense Refill  . Adalimumab (HUMIRA PEN) 40 MG/0.4ML PNKT INJECT 40MG UNDER THE SKIN EVERY 2 WEEKS 2 each 4  . azaTHIOprine (IMURAN) 50 MG tablet Take 1 tablet (50 mg total) by mouth daily. 30 tablet 11  . FIBER PO Take by mouth.    . mesalamine (APRISO) 0.375 g 24  hr capsule Take 4 capsules (1.5 g total) by mouth daily with breakfast. 120 capsule 11  . Multiple Vitamin (MULTIVITAMIN) tablet Take 1 tablet by mouth daily.    . predniSONE (DELTASONE) 10 MG tablet We have sent the following medications to your pharmacy for you to pick up at your convenience: Prednisone 68m tablet 451mby mouth x 1 week. 3058my mouth x 1 week. 71m51m mouth x 1 weeks. 10mg55mmouth daily until your follow up visit .  150 tablet 0   No current facility-administered medications for this visit.    No Known Allergies  Review of Systems:  neg     Physical Exam:    Today's Vitals   11/11/20 1619  BP: 112/70  Pulse: 78  Weight: 204 lb (92.5 kg)  Height: 5' 4"  (1.626 m)   Body mass index is 35.02 kg/m.  Gen: awake, alert, NAD HEENT: anicteric, no pallor CV: RRR, no mrg Pulm: CTA b/l Abd: soft, NT/ND, +BS throughout Ext: no c/c/e Neuro: nonfocal     Raj GCarmell Austria11/29/2021, 4:19 PM  Cc: ColliJanie Morning

## 2020-12-31 ENCOUNTER — Encounter: Payer: Self-pay | Admitting: Gastroenterology

## 2020-12-31 ENCOUNTER — Ambulatory Visit (AMBULATORY_SURGERY_CENTER): Payer: Medicaid Other | Admitting: Gastroenterology

## 2020-12-31 ENCOUNTER — Other Ambulatory Visit: Payer: Self-pay

## 2020-12-31 VITALS — BP 98/64 | HR 65 | Temp 98.1°F | Resp 14 | Ht 64.0 in | Wt 204.0 lb

## 2020-12-31 DIAGNOSIS — K50919 Crohn's disease, unspecified, with unspecified complications: Secondary | ICD-10-CM | POA: Diagnosis not present

## 2020-12-31 DIAGNOSIS — K5090 Crohn's disease, unspecified, without complications: Secondary | ICD-10-CM | POA: Diagnosis not present

## 2020-12-31 DIAGNOSIS — Z1211 Encounter for screening for malignant neoplasm of colon: Secondary | ICD-10-CM | POA: Diagnosis not present

## 2020-12-31 MED ORDER — SODIUM CHLORIDE 0.9 % IV SOLN: 500.0000 mL | Freq: Once | INTRAVENOUS | Status: DC

## 2020-12-31 NOTE — Progress Notes (Signed)
VS by CW. ?

## 2020-12-31 NOTE — Progress Notes (Signed)
Called to room to assist during endoscopic procedure.  Patient ID and intended procedure confirmed with present staff. Received instructions for my participation in the procedure from the performing physician.  

## 2020-12-31 NOTE — Op Note (Signed)
[facility] Patient Name: Jillian Perez Procedure Date: 12/31/2020 1:24 PM MRN: [id number] Endoscopist: Jackquline Denmark , MD Age: 40 Referring MD:  Date of Birth: [birth year] Gender: Female Account #: [id number] Procedure:                Colonoscopy Indications:              L sided Crohn's colitis with exacerbation. Dx                            08/2017 on colon, involving the left colon up to mid                            transverse colon. No perianal or TI disease. CTE                            12/2018: Involving sigmoid colon and rectum. Started                            Humira/imuran 01/2019. Medicines:                Monitored Anesthesia Care Procedure:                Pre-Anesthesia Assessment:                           - Prior to the procedure, a History and Physical                            was performed, and patient medications and                            allergies were reviewed. The patient's tolerance of                            previous anesthesia was also reviewed. The risks                            and benefits of the procedure and the sedation                            options and risks were discussed with the patient.                            All questions were answered, and informed consent                            was obtained. Prior Anticoagulants: The patient has                            taken no previous anticoagulant or antiplatelet                            agents. ASA Grade Assessment: II - A patient with  mild systemic disease. After reviewing the risks                            and benefits, the patient was deemed in                            satisfactory condition to undergo the procedure.                           After obtaining informed consent, the colonoscope                            was passed under direct vision. Throughout the                            procedure, the patient's blood pressure,  pulse, and                            oxygen saturations were monitored continuously. The                            Olympus PCF-H190DL (#[id number]) Colonoscope was                            introduced through the anus and advanced to the 4                            cm into the ileum. The colonoscopy was performed                            without difficulty. The patient tolerated the                            procedure well. The quality of the bowel                            preparation was good. The terminal ileum, ileocecal                            valve, appendiceal orifice, and rectum were                            photographed. Scope In: 1:36:40 PM Scope Out: 1:49:50 PM Scope Withdrawal Time: 0 hours 10 minutes 42 seconds  Total Procedure Duration: 0 hours 13 minutes 10 seconds  Findings:                 Localized moderate inflammation characterized by                            erosions, erythema, friability and linear erosions                            was found in the distal rectum up to 15 cm from the  anal verge. Biopsies were taken with a cold forceps                            for histology.                           The remaining colon appeared normal with                            well-preserved vascular pattern including left                            colon including left colon. Biopsies were taken                            with a cold forceps for histology from right colon                            and from left colon (sent in separate jars).                           The terminal ileum appeared normal. Biopsies were                            taken with a cold forceps for histology.                           The exam was otherwise without abnormality on                            direct and retroflexion views. No perianal fistulas. Complications:            No immediate complications. Estimated Blood Loss:     Estimated blood loss:  none. Impression:               - Localized moderate inflammation was found in the                            distal rectum secondary to proctitis. Biopsied.                           - Otherwise normal colonoscopy to TI. Recommendation:           - Patient has a contact number available for                            emergencies. The signs and symptoms of potential                            delayed complications were discussed with the                            patient. Return to normal activities tomorrow.  Written discharge instructions were provided to the                            patient.                           - Resume previous diet.                           - Continue present medications.                           - Await pathology results.                           - Repeat colonoscopy for surveillance based on                            pathology results.                           - Return to GI clinic in 4 weeks. She is doing very                            well on Humira/Imuran. Not having any symptoms. May                            add Rowasa suppositories or enemas as outpt.                           - The findings and recommendations were discussed                            with the patient's husband Aaron Edelman. Jackquline Denmark, MD 12/31/2020 2:01:22 PM This report has been signed electronically.

## 2020-12-31 NOTE — Progress Notes (Signed)
To PACU, VSS. Report to [email or web address] 

## 2020-12-31 NOTE — Patient Instructions (Signed)
Please read handouts provided. Continue present medications. Await pathology results. Return to GI clinic in 4 weeks.     YOU HAD AN ENDOSCOPIC PROCEDURE TODAY AT [facility]:   Refer to the procedure report that was given to you for any specific questions about what was found during the examination.  If the procedure report does not answer your questions, please call your gastroenterologist to clarify.  If you requested that your care partner not be given the details of your procedure findings, then the procedure report has been included in a sealed envelope for you to review at your convenience later.  YOU SHOULD EXPECT: Some feelings of bloating in the abdomen. Passage of more gas than usual.  Walking can help get rid of the air that was put into your GI tract during the procedure and reduce the bloating. If you had a lower endoscopy (such as a colonoscopy or flexible sigmoidoscopy) you may notice spotting of blood in your stool or on the toilet paper. If you underwent a bowel prep for your procedure, you may not have a normal bowel movement for a few days.  Please Note:  You might notice some irritation and congestion in your nose or some drainage.  This is from the oxygen used during your procedure.  There is no need for concern and it should clear up in a day or so.  SYMPTOMS TO REPORT IMMEDIATELY:   Following lower endoscopy (colonoscopy or flexible sigmoidoscopy):  Excessive amounts of blood in the stool  Significant tenderness or worsening of abdominal pains  Swelling of the abdomen that is new, acute  Fever of 100F or higher   For urgent or emergent issues, a gastroenterologist can be reached at any hour by calling [phone]. Do not use MyChart messaging for urgent concerns.    DIET:  We do recommend a small meal at first, but then you may proceed to your regular diet.  Drink plenty of fluids but you should avoid alcoholic beverages for 24  hours.  ACTIVITY:  You should plan to take it easy for the rest of today and you should NOT DRIVE or use heavy machinery until tomorrow (because of the sedation medicines used during the test).    FOLLOW UP: Our staff will call the number listed on your records 48-72 hours following your procedure to check on you and address any questions or concerns that you may have regarding the information given to you following your procedure. If we do not reach you, we will leave a message.  We will attempt to reach you two times.  During this call, we will ask if you have developed any symptoms of COVID 19. If you develop any symptoms (ie: fever, flu-like symptoms, shortness of breath, cough etc.) before then, please call [phone].  If you test positive for Covid 19 in the 2 weeks post procedure, please call and report this information to [place].    If any biopsies were taken you will be contacted by phone or by letter within the next 1-3 weeks.  Please call us at [phone] if you have not heard about the biopsies in 3 weeks.    SIGNATURES/CONFIDENTIALITY: You and/or your care partner have signed paperwork which will be entered into your electronic medical record.  These signatures attest to the fact that that the information above on your After Visit Summary has been reviewed and is understood.  Full responsibility of the confidentiality of this discharge information lies with you  and/or your care-partner.

## 2021-01-02 ENCOUNTER — Other Ambulatory Visit: Payer: Self-pay | Admitting: Gastroenterology

## 2021-01-02 ENCOUNTER — Telehealth: Payer: Self-pay

## 2021-01-02 DIAGNOSIS — K50919 Crohn's disease, unspecified, with unspecified complications: Secondary | ICD-10-CM

## 2021-01-02 NOTE — Telephone Encounter (Signed)
  Follow up Call-  Call back number 12/31/2020  Post procedure Call Back phone  # [phone]  Permission to leave phone message Yes  Some recent data might be hidden     Patient questions:  Do you have a fever, pain , or abdominal swelling? No. Pain Score  0 *  Have you tolerated food without any problems? Yes.    Have you been able to return to your normal activities? Yes.    Do you have any questions about your discharge instructions: Diet   No. Medications  No. Follow up visit  No.  Do you have questions or concerns about your Care? No.  Actions: * If pain score is 4 or above: No action needed, pain <4.    1. Have you developed a fever since your procedure? no  2.   Have you had an respiratory symptoms (SOB or cough) since your procedure? no  3.   Have you tested positive for COVID 19 since your procedure no  4.   Have you had any family members/close contacts diagnosed with the COVID 19 since your procedure?  no   If yes to any of these questions please route to Joylene John, RN and Joella Prince, RN

## 2021-01-09 ENCOUNTER — Other Ambulatory Visit: Payer: Self-pay | Admitting: Gastroenterology

## 2021-01-19 IMAGING — CT CT ENTEROGRAPHY (ABD-PELV W/ CM)
2 of 5 series · 16 of 46 positions shown, 18 images · IV contrast (omnipaque)
Comparison: None.

CLINICAL DATA: Lower abdominal crampy pain with bloody diarrhea.

EXAM:
CT ABDOMEN AND PELVIS WITH CONTRAST (ENTEROGRAPHY)
TECHNIQUE: Multidetector CT of the abdomen and pelvis during bolus
administration of intravenous contrast. Negative oral contrast was
given.
CONTRAST:  100mL OMNIPAQUE IOHEXOL 300 MG/ML  SOLN

[Series 4: thins · axial · 0.77mm/px · z∈[+1011,+1471]mm · 13 of 504 slices shown, 15 images]
[im 22/504  soft-tissue]
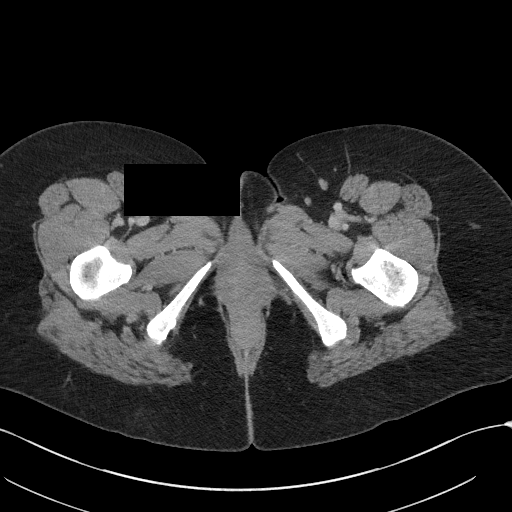
[im 22/504  bone]
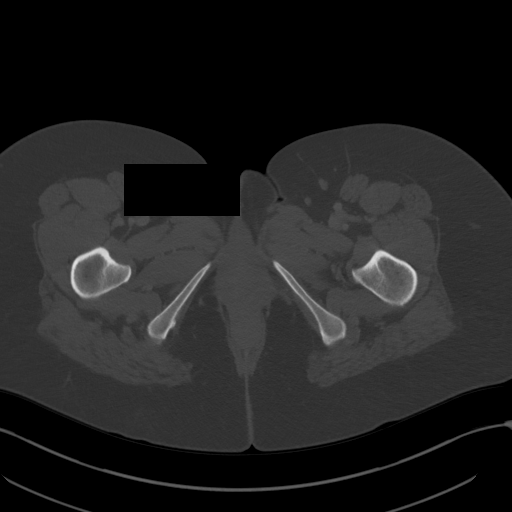
[im 66/504  soft-tissue]
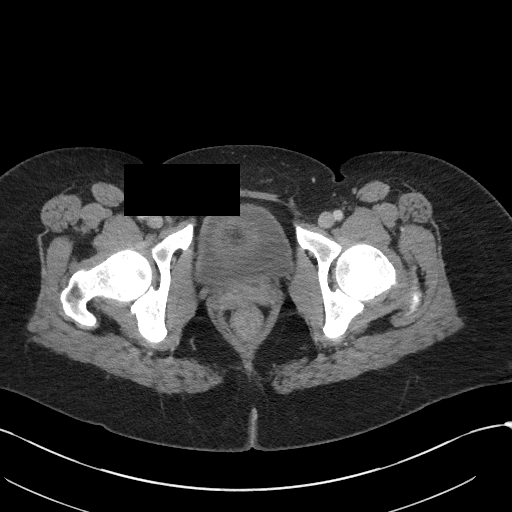
[im 110/504  soft-tissue]
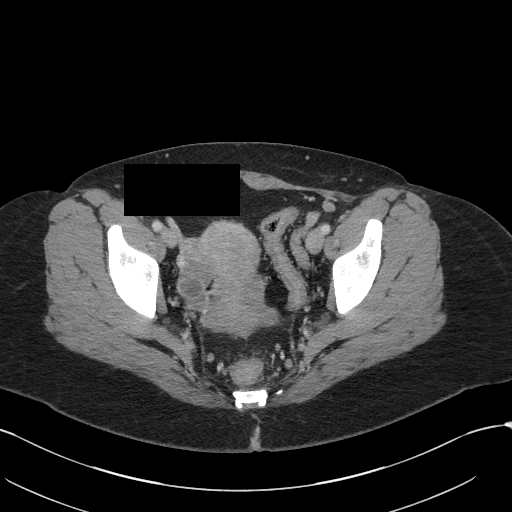
[im 132/504  soft-tissue]
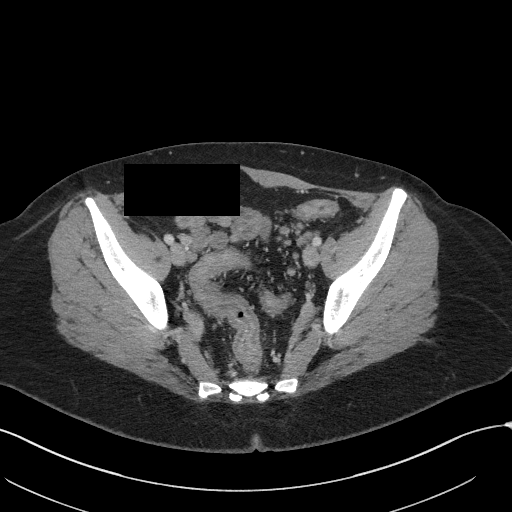
[im 175/504  soft-tissue]
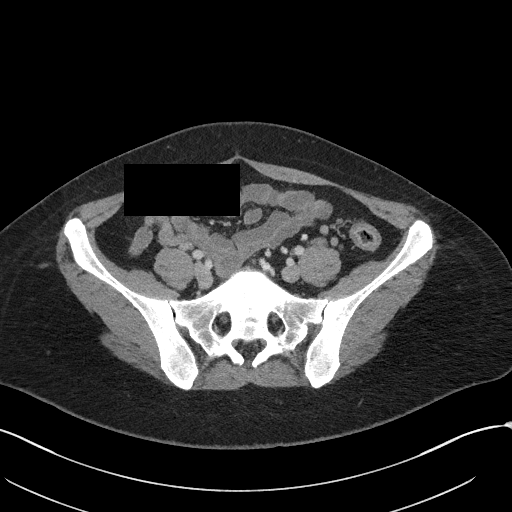
[im 219/504  soft-tissue]
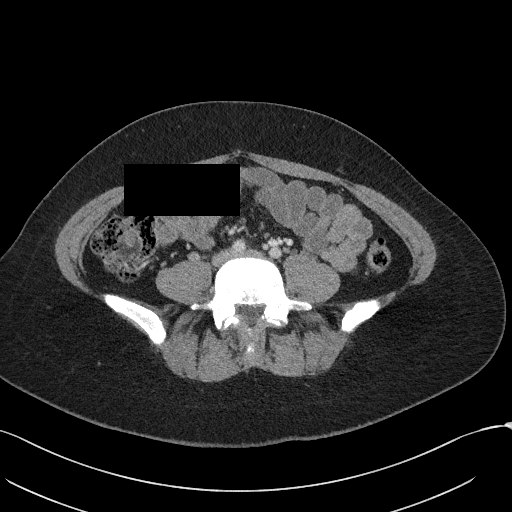
[im 263/504  soft-tissue]
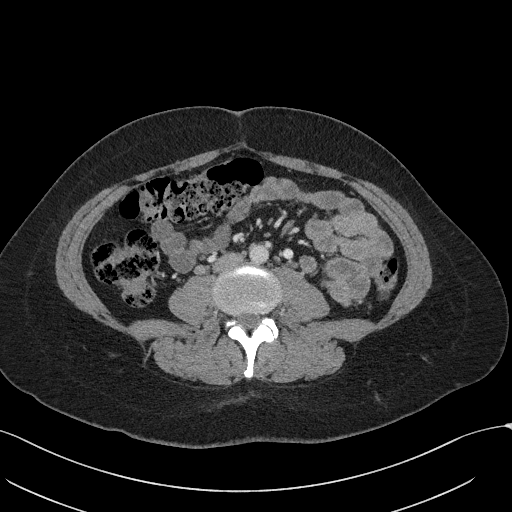
[im 285/504  soft-tissue]
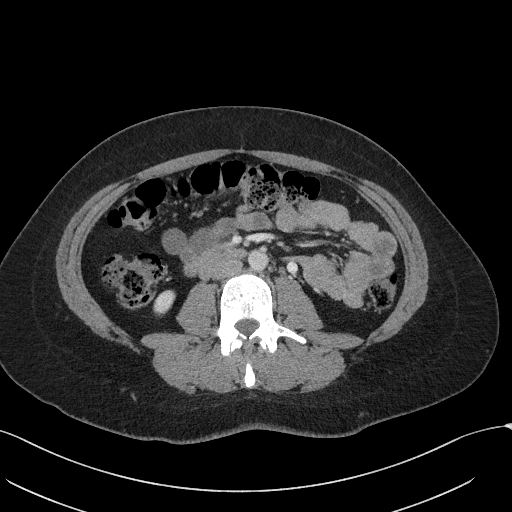
[im 329/504  soft-tissue]
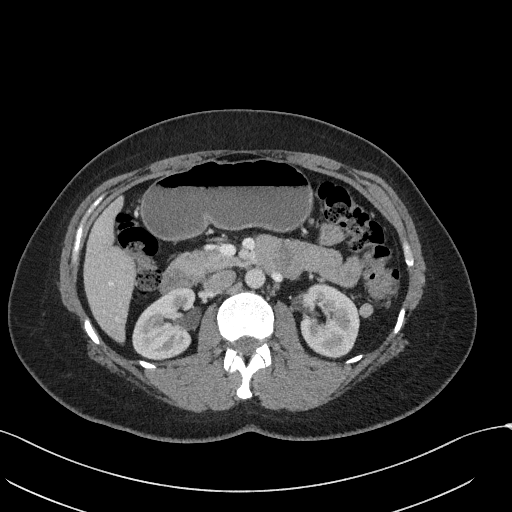
[im 329/504  bone]
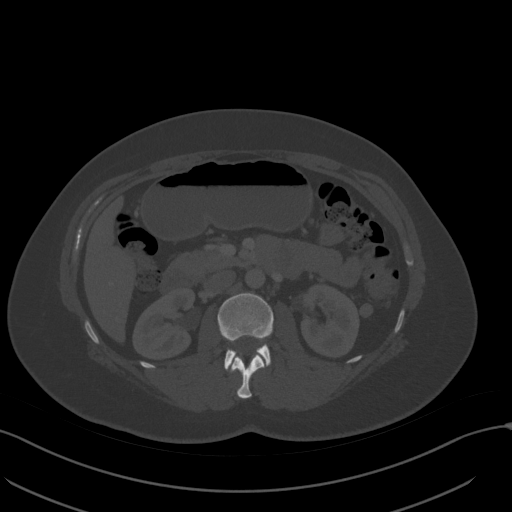
[im 372/504  soft-tissue]
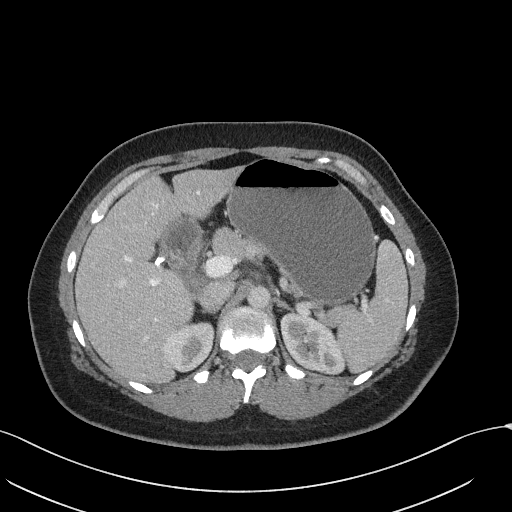
[im 394/504  soft-tissue]
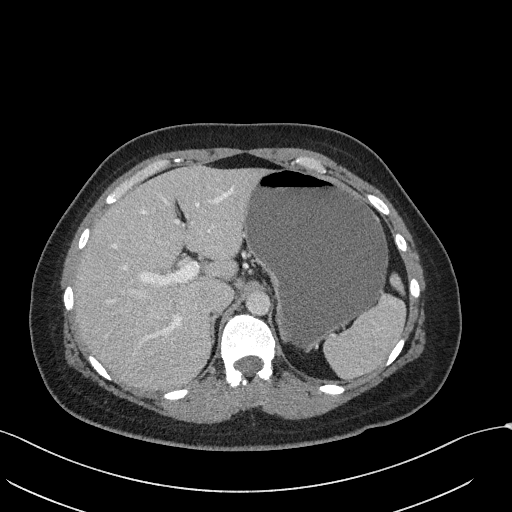
[im 438/504  soft-tissue]
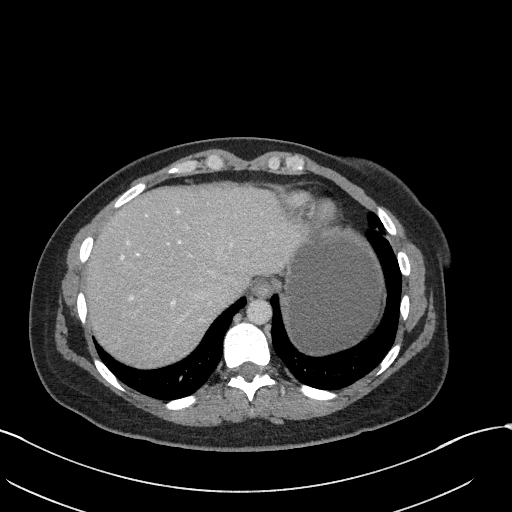
[im 482/504  soft-tissue]
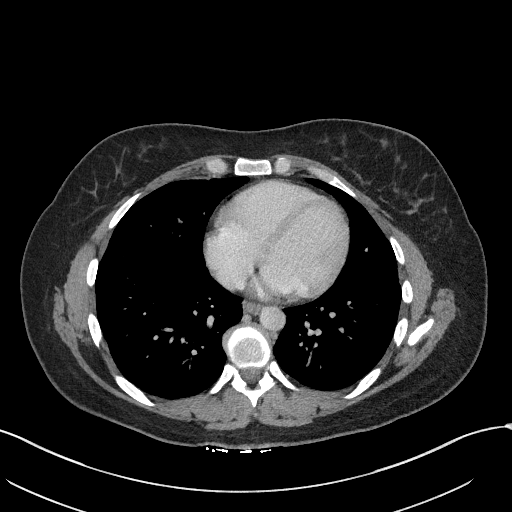

[Series 6: coronal · coronal · 0.79mm/px · 3 of 76 slices shown]
[im 26/76  soft-tissue]
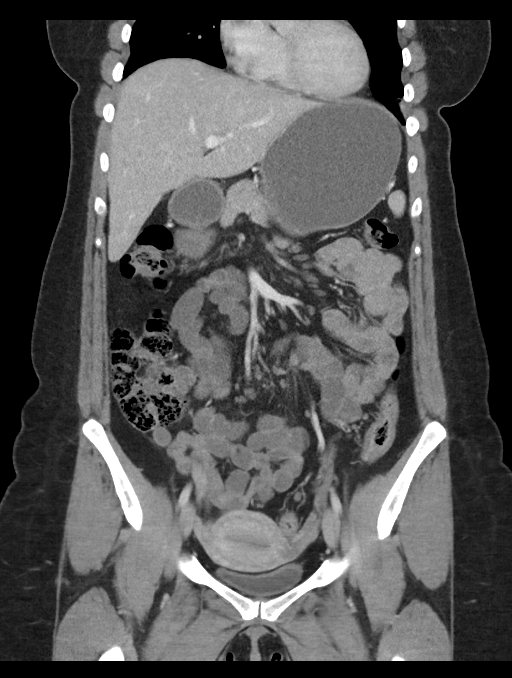
[im 34/76  soft-tissue]
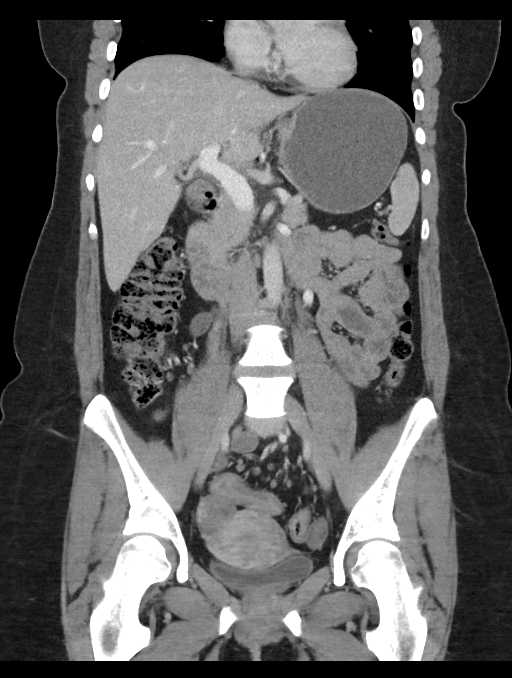
[im 42/76  soft-tissue]
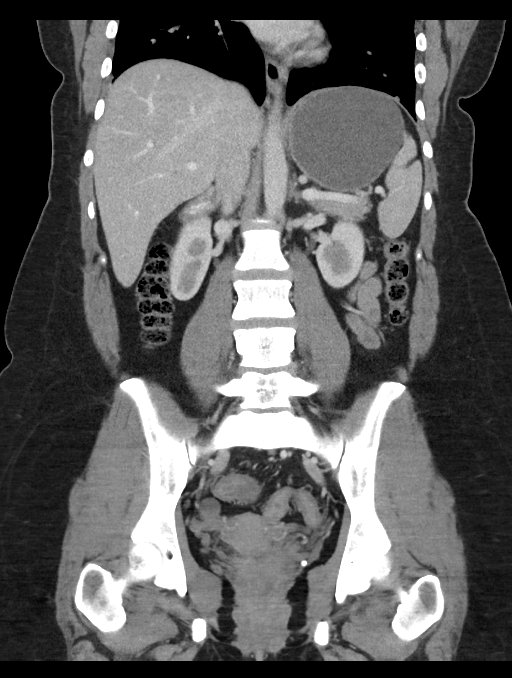

[16 of 46 positions shown; findings below may reference images not displayed]

FINDINGS: Lower chest: Lung bases are clear.

Hepatobiliary: No focal hepatic lesion. No biliary duct dilatation.
Gallbladder is surgically absent. Common bile duct is normal.

Pancreas: Pancreas is normal. No ductal dilatation. No pancreatic
inflammation.

Spleen: Normal spleen

Adrenals/urinary tract: Adrenal glands and kidneys are normal. The
ureters and bladder normal.

Stomach/Bowel: Stomach, small bowel, appendix, and cecum are normal.

The ascending, transverse and proximal descending colon are normal.
In the mid descending colon and extending in the sigmoid colon and
rectum, there is uniform bowel wall thickening. No diverticulosis.
No perforation or abscess. There are multiple small lymph nodes
within the sigmoid mesocolon (image 74/2). Adenopathy extends into
the perirectal fat. For example 8 mm node RIGHT of the rectum on
image 83/2.

Vascular/Lymphatic: Abdominal aorta is normal. The celiac trunk,
SMA, and IMA are patent. No upper abdominal adenopathy. No iliac
adenopathy.

Reproductive: Uterus and ovaries normal. Small cyst in the RIGHT
ovary.

Other: No free fluid.

Musculoskeletal: No aggressive osseous lesion.
IMPRESSION: 1. Long segment of uniform bowel wall thickening involving the
entire sigmoid colon and rectum consistent with segmental colitis.
Extensive adenopathy within the sigmoid mesocolon most consistent
with inflammatory or infectious process. Differential would include
inflammatory bowel disease (ulcer colitis versus Crohn's disease)
versus infectious colitis versus unlikely ischemic colitis.
2. Remainder of the GI tract is normal.
3. Postcholecystectomy.

## 2021-01-21 ENCOUNTER — Encounter: Payer: Self-pay | Admitting: Gastroenterology

## 2021-02-07 DIAGNOSIS — Z79899 Other long term (current) drug therapy: Secondary | ICD-10-CM | POA: Diagnosis not present

## 2021-02-07 DIAGNOSIS — R1030 Lower abdominal pain, unspecified: Secondary | ICD-10-CM | POA: Diagnosis not present

## 2021-02-07 DIAGNOSIS — Z7952 Long term (current) use of systemic steroids: Secondary | ICD-10-CM | POA: Diagnosis not present

## 2021-02-07 DIAGNOSIS — R1032 Left lower quadrant pain: Secondary | ICD-10-CM | POA: Diagnosis not present

## 2021-02-07 DIAGNOSIS — R1031 Right lower quadrant pain: Secondary | ICD-10-CM | POA: Diagnosis not present

## 2021-03-17 ENCOUNTER — Other Ambulatory Visit: Payer: Self-pay | Admitting: Gastroenterology

## 2021-04-02 DIAGNOSIS — F329 Major depressive disorder, single episode, unspecified: Secondary | ICD-10-CM | POA: Diagnosis not present

## 2021-04-02 DIAGNOSIS — Z01419 Encounter for gynecological examination (general) (routine) without abnormal findings: Secondary | ICD-10-CM | POA: Diagnosis not present

## 2021-04-15 ENCOUNTER — Other Ambulatory Visit: Payer: Self-pay | Admitting: Gastroenterology

## 2021-04-15 DIAGNOSIS — K50919 Crohn's disease, unspecified, with unspecified complications: Secondary | ICD-10-CM

## 2021-04-29 DIAGNOSIS — J029 Acute pharyngitis, unspecified: Secondary | ICD-10-CM | POA: Diagnosis not present

## 2021-04-29 DIAGNOSIS — Z20822 Contact with and (suspected) exposure to covid-19: Secondary | ICD-10-CM | POA: Diagnosis not present

## 2021-06-11 DIAGNOSIS — Z9851 Tubal ligation status: Secondary | ICD-10-CM | POA: Diagnosis not present

## 2021-06-11 DIAGNOSIS — Z1151 Encounter for screening for human papillomavirus (HPV): Secondary | ICD-10-CM | POA: Diagnosis not present

## 2021-06-11 DIAGNOSIS — Z0000 Encounter for general adult medical examination without abnormal findings: Secondary | ICD-10-CM | POA: Diagnosis not present

## 2021-06-11 DIAGNOSIS — Z01419 Encounter for gynecological examination (general) (routine) without abnormal findings: Secondary | ICD-10-CM | POA: Diagnosis not present

## 2021-07-10 DIAGNOSIS — Z1231 Encounter for screening mammogram for malignant neoplasm of breast: Secondary | ICD-10-CM | POA: Diagnosis not present

## 2021-07-21 ENCOUNTER — Other Ambulatory Visit: Payer: Self-pay | Admitting: Gastroenterology

## 2021-07-21 DIAGNOSIS — K50919 Crohn's disease, unspecified, with unspecified complications: Secondary | ICD-10-CM

## 2021-08-13 DIAGNOSIS — N6312 Unspecified lump in the right breast, upper inner quadrant: Secondary | ICD-10-CM | POA: Diagnosis not present

## 2021-08-13 DIAGNOSIS — R928 Other abnormal and inconclusive findings on diagnostic imaging of breast: Secondary | ICD-10-CM | POA: Diagnosis not present

## 2021-08-13 DIAGNOSIS — N6311 Unspecified lump in the right breast, upper outer quadrant: Secondary | ICD-10-CM | POA: Diagnosis not present

## 2021-08-17 DIAGNOSIS — S60221A Contusion of right hand, initial encounter: Secondary | ICD-10-CM | POA: Diagnosis not present

## 2021-08-17 DIAGNOSIS — M79641 Pain in right hand: Secondary | ICD-10-CM | POA: Diagnosis not present

## 2021-08-20 ENCOUNTER — Telehealth: Payer: Self-pay

## 2021-08-20 NOTE — Telephone Encounter (Signed)
Received PA Approval for HUMIRA PEN 40 MG/0.4ML from [facility] from 08/20/21 - 08/20/22. Pharmacy is informed.

## 2021-08-26 ENCOUNTER — Other Ambulatory Visit: Payer: Self-pay | Admitting: Gastroenterology

## 2021-08-26 DIAGNOSIS — K50919 Crohn's disease, unspecified, with unspecified complications: Secondary | ICD-10-CM

## 2021-10-24 DIAGNOSIS — F4322 Adjustment disorder with anxiety: Secondary | ICD-10-CM | POA: Diagnosis not present

## 2021-10-29 DIAGNOSIS — F4322 Adjustment disorder with anxiety: Secondary | ICD-10-CM | POA: Diagnosis not present

## 2021-11-12 DIAGNOSIS — F4322 Adjustment disorder with anxiety: Secondary | ICD-10-CM | POA: Diagnosis not present

## 2021-11-19 DIAGNOSIS — F4322 Adjustment disorder with anxiety: Secondary | ICD-10-CM | POA: Diagnosis not present

## 2021-11-25 ENCOUNTER — Other Ambulatory Visit: Payer: Self-pay | Admitting: Gastroenterology

## 2021-12-01 DIAGNOSIS — F4322 Adjustment disorder with anxiety: Secondary | ICD-10-CM | POA: Diagnosis not present

## 2021-12-24 DIAGNOSIS — F4322 Adjustment disorder with anxiety: Secondary | ICD-10-CM | POA: Diagnosis not present

## 2022-01-14 DIAGNOSIS — F4322 Adjustment disorder with anxiety: Secondary | ICD-10-CM | POA: Diagnosis not present

## 2022-01-31 ENCOUNTER — Other Ambulatory Visit: Payer: Self-pay | Admitting: Gastroenterology

## 2022-02-04 DIAGNOSIS — F4322 Adjustment disorder with anxiety: Secondary | ICD-10-CM | POA: Diagnosis not present

## 2022-02-11 DIAGNOSIS — F4322 Adjustment disorder with anxiety: Secondary | ICD-10-CM | POA: Diagnosis not present

## 2022-02-15 DIAGNOSIS — Z20822 Contact with and (suspected) exposure to covid-19: Secondary | ICD-10-CM | POA: Diagnosis not present

## 2022-02-15 DIAGNOSIS — J069 Acute upper respiratory infection, unspecified: Secondary | ICD-10-CM | POA: Diagnosis not present

## 2022-02-15 DIAGNOSIS — J029 Acute pharyngitis, unspecified: Secondary | ICD-10-CM | POA: Diagnosis not present

## 2022-02-18 DIAGNOSIS — N6312 Unspecified lump in the right breast, upper inner quadrant: Secondary | ICD-10-CM | POA: Diagnosis not present

## 2022-02-18 DIAGNOSIS — N6310 Unspecified lump in the right breast, unspecified quadrant: Secondary | ICD-10-CM | POA: Diagnosis not present

## 2022-03-11 DIAGNOSIS — F4322 Adjustment disorder with anxiety: Secondary | ICD-10-CM | POA: Diagnosis not present

## 2022-03-24 ENCOUNTER — Other Ambulatory Visit: Payer: Self-pay

## 2022-03-24 DIAGNOSIS — K50919 Crohn's disease, unspecified, with unspecified complications: Secondary | ICD-10-CM

## 2022-03-24 MED ORDER — HUMIRA (2 PEN) 40 MG/0.4ML ~~LOC~~ AJKT: AUTO-INJECTOR | SUBCUTANEOUS | 8 refills | Status: AC

## 2022-03-24 NOTE — Progress Notes (Signed)
Rx request came by fax from [facility] so it was sent there ?

## 2022-04-01 DIAGNOSIS — F4322 Adjustment disorder with anxiety: Secondary | ICD-10-CM | POA: Diagnosis not present

## 2022-04-22 DIAGNOSIS — F4322 Adjustment disorder with anxiety: Secondary | ICD-10-CM | POA: Diagnosis not present

## 2022-04-30 DIAGNOSIS — F411 Generalized anxiety disorder: Secondary | ICD-10-CM | POA: Diagnosis not present

## 2022-05-06 DIAGNOSIS — F4322 Adjustment disorder with anxiety: Secondary | ICD-10-CM | POA: Diagnosis not present

## 2022-05-11 ENCOUNTER — Other Ambulatory Visit: Payer: Self-pay | Admitting: Gastroenterology

## 2022-05-28 DIAGNOSIS — F4322 Adjustment disorder with anxiety: Secondary | ICD-10-CM | POA: Diagnosis not present

## 2022-06-04 DIAGNOSIS — F4322 Adjustment disorder with anxiety: Secondary | ICD-10-CM | POA: Diagnosis not present

## 2022-07-23 DIAGNOSIS — F4322 Adjustment disorder with anxiety: Secondary | ICD-10-CM | POA: Diagnosis not present

## 2022-07-27 ENCOUNTER — Telehealth: Payer: Self-pay | Admitting: Pharmacy Technician

## 2022-07-27 ENCOUNTER — Other Ambulatory Visit (HOSPITAL_COMMUNITY): Payer: Self-pay

## 2022-07-27 NOTE — Telephone Encounter (Signed)
Patient Advocate Encounter  Prior Authorization for HUMIRA 40MG has been approved.    PA# [id number] Effective dates: 8.14.23 through 8.13.24  Matin Mattioli B. CPhT P: [phone] F: [phone]   Received notification from [place] that prior authorization for HUMIRA 40MG is required.   PA submitted on 8.14.23 Key [id number] Status is pending    Luciano Cutter, CPhT Patient Advocate Phone: [phone]

## 2022-08-20 DIAGNOSIS — H5213 Myopia, bilateral: Secondary | ICD-10-CM | POA: Diagnosis not present

## 2022-09-14 DIAGNOSIS — F4322 Adjustment disorder with anxiety: Secondary | ICD-10-CM | POA: Diagnosis not present

## 2022-10-23 DIAGNOSIS — F4322 Adjustment disorder with anxiety: Secondary | ICD-10-CM | POA: Diagnosis not present

## 2022-10-29 DIAGNOSIS — N201 Calculus of ureter: Secondary | ICD-10-CM | POA: Diagnosis not present

## 2022-10-29 DIAGNOSIS — K5730 Diverticulosis of large intestine without perforation or abscess without bleeding: Secondary | ICD-10-CM | POA: Diagnosis not present

## 2022-10-29 DIAGNOSIS — N132 Hydronephrosis with renal and ureteral calculous obstruction: Secondary | ICD-10-CM | POA: Diagnosis not present

## 2022-10-29 DIAGNOSIS — N202 Calculus of kidney with calculus of ureter: Secondary | ICD-10-CM | POA: Diagnosis not present

## 2022-12-24 DIAGNOSIS — J029 Acute pharyngitis, unspecified: Secondary | ICD-10-CM | POA: Diagnosis not present

## 2022-12-31 ENCOUNTER — Other Ambulatory Visit: Payer: Self-pay | Admitting: Gastroenterology

## 2022-12-31 DIAGNOSIS — F4322 Adjustment disorder with anxiety: Secondary | ICD-10-CM | POA: Diagnosis not present

## 2023-01-25 DIAGNOSIS — F4322 Adjustment disorder with anxiety: Secondary | ICD-10-CM | POA: Diagnosis not present

## 2023-02-05 DIAGNOSIS — H0014 Chalazion left upper eyelid: Secondary | ICD-10-CM | POA: Diagnosis not present

## 2023-02-05 DIAGNOSIS — R053 Chronic cough: Secondary | ICD-10-CM | POA: Diagnosis not present

## 2023-03-02 ENCOUNTER — Other Ambulatory Visit: Payer: Self-pay | Admitting: Gastroenterology

## 2023-03-27 DIAGNOSIS — J020 Streptococcal pharyngitis: Secondary | ICD-10-CM | POA: Diagnosis not present

## 2023-03-31 ENCOUNTER — Other Ambulatory Visit (INDEPENDENT_AMBULATORY_CARE_PROVIDER_SITE_OTHER): Payer: Medicaid Other

## 2023-03-31 ENCOUNTER — Ambulatory Visit: Payer: Medicaid Other | Admitting: Gastroenterology

## 2023-03-31 ENCOUNTER — Encounter: Payer: Self-pay | Admitting: Gastroenterology

## 2023-03-31 VITALS — BP 94/70 | HR 72 | Ht 64.0 in | Wt 213.0 lb

## 2023-03-31 DIAGNOSIS — R109 Unspecified abdominal pain: Secondary | ICD-10-CM

## 2023-03-31 DIAGNOSIS — K50119 Crohn's disease of large intestine with unspecified complications: Secondary | ICD-10-CM

## 2023-03-31 DIAGNOSIS — K580 Irritable bowel syndrome with diarrhea: Secondary | ICD-10-CM

## 2023-03-31 LAB — COMPREHENSIVE METABOLIC PANEL
ALT: 31 U/L (ref 0–35)
AST: 22 U/L (ref 0–37)
Albumin: 4.3 g/dL (ref 3.5–5.2)
Alkaline Phosphatase: 93 U/L (ref 39–117)
BUN: 13 mg/dL (ref 6–23)
CO2: 25 mEq/L (ref 19–32)
Calcium: 10.3 mg/dL (ref 8.4–10.5)
Chloride: 103 mEq/L (ref 96–112)
Creatinine, Ser: 0.76 mg/dL (ref 0.40–1.20)
GFR: 97.54 mL/min (ref >60.00)
Glucose, Bld: 89 mg/dL (ref 70–99)
Potassium: 3.9 mEq/L (ref 3.5–5.1)
Sodium: 137 mEq/L (ref 135–145)
Total Bilirubin: 0.4 mg/dL (ref 0.2–1.2)
Total Protein: 7.8 g/dL (ref 6.0–8.3)

## 2023-03-31 LAB — CBC WITH DIFFERENTIAL/PLATELET
Basophils Absolute: 0.1 10*3/uL (ref 0.0–0.1)
Basophils Relative: 1 % (ref 0.0–3.0)
Eosinophils Absolute: 0.4 10*3/uL (ref 0.0–0.7)
Eosinophils Relative: 4.2 % (ref 0.0–5.0)
HCT: 37.6 % (ref 36.0–46.0)
Hemoglobin: 12.5 g/dL (ref 12.0–15.0)
Lymphocytes Relative: 38.3 % (ref 12.0–46.0)
Lymphs Abs: 3.6 10*3/uL (ref 0.7–4.0)
MCHC: 33.2 g/dL (ref 30.0–36.0)
MCV: 92.2 fl (ref 78.0–100.0)
Monocytes Absolute: 1 10*3/uL (ref 0.1–1.0)
Monocytes Relative: 10.3 % (ref 3.0–12.0)
Neutro Abs: 4.4 10*3/uL (ref 1.4–7.7)
Neutrophils Relative %: 46.2 % (ref 43.0–77.0)
Platelets: 343 10*3/uL (ref 150.0–400.0)
RBC: 4.07 Mil/uL (ref 3.87–5.11)
RDW: 13.6 % (ref 11.5–15.5)
WBC: 9.4 10*3/uL (ref 4.0–10.5)

## 2023-03-31 LAB — C-REACTIVE PROTEIN: CRP: 1.1 mg/dL (ref 0.5–20.0)

## 2023-03-31 LAB — LIPASE: Lipase: 31 U/L (ref 11.0–59.0)

## 2023-03-31 LAB — SEDIMENTATION RATE: Sed Rate: 48 mm/hr — ABNORMAL HIGH (ref 0–20)

## 2023-03-31 MED ORDER — DICYCLOMINE HCL 10 MG PO CAPS: 10.0000 mg | ORAL_CAPSULE | Freq: Three times a day (TID) | ORAL | 4 refills | Status: AC

## 2023-03-31 NOTE — Progress Notes (Signed)
Chief Complaint: FU  Referring Provider:  Irena Reichmann, DO      ASSESSMENT AND PLAN;   #1.  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  No perianal or TI disease. CTE 12/2018: Involving sigmoid colon and rectum. Started Humira/imuran 01/2019.  Stopped humira on her own d/t S/Es July 2023.  #2. 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  Plan: - Continue Imuran  po qd #30, 11 refills.  May increase dose if needed. - Check CBC, CMP, sed rate, CRP,lipase - Stool studies: GI pathogen and calprotectin - Continue apriso 4/day for now.   - CTE  - Continue lactose-free diet. - Bentyl  po QID #120, 4RF - FU in 12 weeks - IBD clinic in [place].   HPI:    Jillian Perez is a 42 y.o. female   For FU Does feel somewhat better except bloating. Bentyl helps a lot  Off humira July 2023 on her own- upsetting stomach.  Taking apriso 4/day And imuran  po QD. Does not want to go back on Humira Offered Rowasa enemas-she does not like to take enemas.  Bms 4/day No noct S/S Denies having any urgency No melena or hematochezia No recent antibiotics or travel. No nonsteroidals   Past GI procedures: -EGD 03/31/2010-mild gastritis, negative small bowel biopsies for celiac  Colonoscopy 12/31/2020:  - Localized moderate inflammation was found in the distal rectum secondary to proctitis (upto 15 cm). Biopsied.  - Otherwise normal colonoscopy to TI.  -Colonoscopy 08/25/2017 colitis involving left colon, moderate activity.  Normal TI and right colon. Bx- Crohn's disease, inflammatory polyp.  Normal TI and right colonic biopsies. -CTE 12/2018: 1. Long segment of uniform bowel wall thickening involving the entire sigmoid colon and rectum consistent with segmental colitis. Extensive adenopathy within the sigmoid mesocolon most  consistent with inflammatory or infectious process. Differential would include inflammatory bowel disease (ulcer colitis versus Crohn's disease) versus infectious colitis versus unlikely ischemic colitis. 2. Remainder of the GI tract is normal. 3. Postcholecystectomy.   Additional social history Dad had lymphoma Mother had amyloidosis, cancer of the breast  SH-schoolteacher for city [place] Past Medical History:  Diagnosis Date   Crohn disease    Medical history non-contributory     Past Surgical History:  Procedure Laterality Date   CHOLECYSTECTOMY     NASAL SEPTUM SURGERY  2018   PILONIDAL CYST EXCISION      Family History  Problem Relation Age of Onset   Diabetes Mother    Hypertension Mother    Other Mother        amyliodosis   Lymphoma Father    Hypertension Father    Breast cancer Maternal Grandmother    Skin cancer Maternal Grandfather    Colon cancer Neg Hx    Esophageal cancer Neg Hx    Rectal cancer Neg Hx    Stomach cancer Neg Hx     Social History   Tobacco Use   Smoking status: Never   Smokeless tobacco: Never  Vaping Use   Vaping Use: Never used  Substance Use Topics   Alcohol use: No   Drug use: No    Current Outpatient Medications  Medication Sig Dispense Refill   azaTHIOprine (IMURAN) 50 MG tablet Take 1 tablet (50 mg total) by mouth daily. 30 tablet 5   escitalopram (LEXAPRO) 5 MG tablet  Take 1 tablet by mouth daily.     FIBER PO Take by mouth.     mesalamine (APRISO) 0.375 g 24 hr capsule Take 4 capsules (1.5 g total) by mouth daily. Call [phone] to schedule an office visit for more refills 120 capsule 2   Multiple Vitamin (MULTIVITAMIN) tablet Take 1 tablet by mouth daily.     Adalimumab (HUMIRA PEN) 40 MG/0.4ML PNKT Inject  under the skin every 2 weeks (Patient not taking: Reported on 03/31/2023) 2 each 8   dicyclomine (BENTYL) 10 MG capsule Take I tablet twice daily. (Patient not taking: Reported on 03/31/2023) 180 capsule  2   No current facility-administered medications for this visit.    No Known Allergies  Review of Systems:  neg     Physical Exam:    Today's Vitals   03/31/23 1445  BP: 94/70  Pulse: 72  Weight: 213 lb (96.6 kg)  Height:  (1.626 m)   Body mass index is 36.56 kg/m.  Gen: awake, alert, NAD HEENT: anicteric, no pallor CV: RRR, no mrg Pulm: CTA b/l Abd: soft, NT/ND, +BS throughout Ext: no c/c/e Neuro: nonfocal     Edman Circle, MD 03/31/2023, 2:58 PM  Cc: Irena Reichmann, DO

## 2023-03-31 NOTE — Patient Instructions (Addendum)
_______________________________________________________  If your blood pressure at your visit was 140/90 or greater, please contact your primary care physician to follow up on this.  _______________________________________________________  If you are age 41 or older, your body mass index should be between 23-30. Your Body mass index is 36.56 kg/m. If this is out of the aforementioned range listed, please consider follow up with your Primary Care Provider.  If you are age 22 or younger, your body mass index should be between 19-25. Your Body mass index is 36.56 kg/m. If this is out of the aformentioned range listed, please consider follow up with your Primary Care Provider.   ________________________________________________________  The [facility] providers would like to encourage you to use [facility] to communicate with providers for non-urgent requests or questions.  Due to long hold times on the telephone, sending your provider a message by [facility] may be a faster and more efficient way to get a response.  Please allow 48 business hours for a response.  Please remember that this is for non-urgent requests.  _______________________________________________________  Your provider has requested that you go to the basement level for lab work before leaving today. Press "B" on the elevator. The lab is located at the first door on the left as you exit the elevator.  Continue lactose free diet. Continue imuran and apriso  You have been scheduled for an appointment with Dr. Chales Abrahams on 07-18 at 150pm . Please arrive 10 minutes early for your appointment.  Please call in 3 weeks about your referral to the IBD clinic with [facility] if you haven't heard anything from [place].  You have been scheduled for a CT enterography at [facility] (First floor of hospital)  on Tuesday May 7,2024 at       11:30am .Please arrive 1 hour and 15 minutes prior to scheduled appointment time. Nothing by mouth 4 hours prior to  this appointment. If you have any questions please call [phone].  Thank you,  Dr. Lynann Bologna

## 2023-04-02 ENCOUNTER — Telehealth: Payer: Self-pay

## 2023-04-02 NOTE — Telephone Encounter (Signed)
Make patient aware referral for ibd clinic sent to [phone] and gave her number to call as [phone]

## 2023-04-06 LAB — CALPROTECTIN, FECAL: Calprotectin, Fecal: 124 ug/g — ABNORMAL HIGH (ref 0–120)

## 2023-04-06 NOTE — Telephone Encounter (Signed)
Stool specimen came back negative for infections. LVM for patient

## 2023-04-13 ENCOUNTER — Encounter: Payer: Self-pay | Admitting: Gastroenterology

## 2023-04-18 DIAGNOSIS — Z8709 Personal history of other diseases of the respiratory system: Secondary | ICD-10-CM | POA: Diagnosis not present

## 2023-04-18 DIAGNOSIS — J069 Acute upper respiratory infection, unspecified: Secondary | ICD-10-CM | POA: Diagnosis not present

## 2023-04-18 DIAGNOSIS — J9801 Acute bronchospasm: Secondary | ICD-10-CM | POA: Diagnosis not present

## 2023-04-20 ENCOUNTER — Ambulatory Visit (HOSPITAL_COMMUNITY): Payer: Medicaid Other

## 2023-04-28 ENCOUNTER — Other Ambulatory Visit: Payer: Self-pay | Admitting: Gastroenterology

## 2023-07-01 ENCOUNTER — Ambulatory Visit: Payer: Medicaid Other | Admitting: Gastroenterology

## 2023-07-03 ENCOUNTER — Telehealth: Payer: Self-pay | Admitting: Pharmacy Technician

## 2023-07-03 ENCOUNTER — Other Ambulatory Visit (HOSPITAL_COMMUNITY): Payer: Self-pay

## 2023-07-03 NOTE — Telephone Encounter (Signed)
Pharmacy Patient Advocate Encounter  Received notification from [facility] that Prior Authorization for HUMIRA 40MG  has been APPROVED from 7.20.24 to 7.20.25.Marland Kitchen  PA #/Case ID/Reference #: [id number]  Unable to do test billing due to DUR reject.    Received notification from CoverMyMeds that prior authorization for HUMIRA 40MG  is required/requested.   Insurance verification completed.   The patient is insured through [facility] .   Per test claim: PA submitted to [facility] via CoverMyMeds Key/confirmation #/EOC [id number] Status is pending

## 2024-03-30 ENCOUNTER — Other Ambulatory Visit: Payer: Self-pay | Admitting: Gastroenterology

## 2024-03-30 DIAGNOSIS — K50119 Crohn's disease of large intestine with unspecified complications: Secondary | ICD-10-CM

## 2024-03-30 DIAGNOSIS — R109 Unspecified abdominal pain: Secondary | ICD-10-CM

## 2024-03-30 DIAGNOSIS — K580 Irritable bowel syndrome with diarrhea: Secondary | ICD-10-CM

## 2024-04-19 ENCOUNTER — Telehealth: Payer: Self-pay | Admitting: Gastroenterology

## 2024-04-19 NOTE — Telephone Encounter (Signed)
 Patient requesting to transfer prescription to [facility] pharmacy in [place].

## 2024-04-20 NOTE — Telephone Encounter (Signed)
 LVM for patient to call back  She can call [facility] herself and tell them the prescription and how she needs to take it she and where the prescription is at with the phone number of the other pharmacy since no one told me the name of the medication to get transferred
